# Patient Record
Sex: Female | Born: 1970
Health system: Southern US, Community
[De-identification: ages and names within clinical notes are randomized; demographics above are authoritative.]

## PROBLEM LIST (undated history)

## (undated) DIAGNOSIS — E079 Disorder of thyroid, unspecified: Secondary | ICD-10-CM

## (undated) DIAGNOSIS — F32A Depression, unspecified: Secondary | ICD-10-CM

## (undated) DIAGNOSIS — E785 Hyperlipidemia, unspecified: Secondary | ICD-10-CM

## (undated) DIAGNOSIS — F419 Anxiety disorder, unspecified: Secondary | ICD-10-CM

## (undated) DIAGNOSIS — T7840XA Allergy, unspecified, initial encounter: Secondary | ICD-10-CM

## (undated) HISTORY — DX: Hyperlipidemia, unspecified: E78.5

## (undated) HISTORY — DX: Allergy, unspecified, initial encounter: T78.40XA

## (undated) HISTORY — DX: Anxiety disorder, unspecified: F41.9

## (undated) HISTORY — DX: Depression, unspecified: F32.A

## (undated) HISTORY — PX: KNEE SURGERY: SHX244

---

## 2005-01-28 ENCOUNTER — Encounter: Admission: RE | Admit: 2005-01-28 | Discharge: 2005-01-28 | Payer: Self-pay | Admitting: Internal Medicine

## 2005-02-02 ENCOUNTER — Encounter: Admission: RE | Admit: 2005-02-02 | Discharge: 2005-05-03 | Payer: Self-pay | Admitting: Internal Medicine

## 2007-11-11 ENCOUNTER — Emergency Department (HOSPITAL_COMMUNITY): Admission: EM | Admit: 2007-11-11 | Discharge: 2007-11-11 | Payer: Self-pay | Admitting: Emergency Medicine

## 2010-07-04 ENCOUNTER — Encounter: Payer: Self-pay | Admitting: Internal Medicine

## 2014-01-30 ENCOUNTER — Other Ambulatory Visit: Payer: Self-pay

## 2014-02-01 ENCOUNTER — Other Ambulatory Visit: Payer: Self-pay

## 2014-02-01 DIAGNOSIS — Z1231 Encounter for screening mammogram for malignant neoplasm of breast: Secondary | ICD-10-CM

## 2014-02-05 ENCOUNTER — Ambulatory Visit
Admission: RE | Admit: 2014-02-05 | Discharge: 2014-02-05 | Disposition: A | Discharge status: Home / Self Care | Payer: BC Managed Care – PPO | Source: Ambulatory Visit

## 2014-02-05 DIAGNOSIS — Z1231 Encounter for screening mammogram for malignant neoplasm of breast: Secondary | ICD-10-CM

## 2014-02-07 ENCOUNTER — Other Ambulatory Visit: Payer: Self-pay | Admitting: Obstetrics & Gynecology

## 2014-02-07 DIAGNOSIS — R928 Other abnormal and inconclusive findings on diagnostic imaging of breast: Secondary | ICD-10-CM

## 2014-02-12 HISTORY — PX: BREAST BIOPSY: SHX20

## 2014-02-14 ENCOUNTER — Ambulatory Visit
Admission: RE | Admit: 2014-02-14 | Discharge: 2014-02-14 | Disposition: A | Discharge status: Home / Self Care | Payer: BC Managed Care – PPO | Source: Ambulatory Visit | Attending: Obstetrics & Gynecology | Admitting: Obstetrics & Gynecology

## 2014-02-14 ENCOUNTER — Other Ambulatory Visit: Payer: Self-pay | Admitting: Obstetrics & Gynecology

## 2014-02-14 DIAGNOSIS — R928 Other abnormal and inconclusive findings on diagnostic imaging of breast: Secondary | ICD-10-CM

## 2014-02-21 ENCOUNTER — Ambulatory Visit
Admission: RE | Admit: 2014-02-21 | Discharge: 2014-02-21 | Disposition: A | Discharge status: Home / Self Care | Payer: BC Managed Care – PPO | Source: Ambulatory Visit | Attending: Obstetrics & Gynecology | Admitting: Obstetrics & Gynecology

## 2014-02-21 DIAGNOSIS — R928 Other abnormal and inconclusive findings on diagnostic imaging of breast: Secondary | ICD-10-CM

## 2014-04-12 ENCOUNTER — Encounter (HOSPITAL_COMMUNITY): Payer: Self-pay | Admitting: Emergency Medicine

## 2014-04-12 ENCOUNTER — Emergency Department (HOSPITAL_COMMUNITY)
Admission: EM | Admit: 2014-04-12 | Discharge: 2014-04-12 | Disposition: A | Discharge status: Home / Self Care | Payer: BC Managed Care – PPO | Source: Home / Self Care | Attending: Family Medicine | Admitting: Family Medicine

## 2014-04-12 DIAGNOSIS — L247 Irritant contact dermatitis due to plants, except food: Secondary | ICD-10-CM

## 2014-04-12 HISTORY — DX: Disorder of thyroid, unspecified: E07.9

## 2014-04-12 MED ORDER — METHYLPREDNISOLONE SODIUM SUCC 125 MG IJ SOLR
INTRAMUSCULAR | Status: AC
  Filled 2014-04-12: qty 2

## 2014-04-12 MED ORDER — METHYLPREDNISOLONE SODIUM SUCC 125 MG IJ SOLR
125.0000 mg | Freq: Once | INTRAMUSCULAR | Status: AC
  Administered 2014-04-12: 125 mg via INTRAMUSCULAR

## 2014-04-12 MED ORDER — PREDNISONE 10 MG PO KIT: PACK | ORAL | Status: DC

## 2014-04-12 MED ORDER — HYDROXYZINE HCL 25 MG PO TABS: 25.0000 mg | ORAL_TABLET | ORAL | Status: DC | PRN

## 2014-04-12 NOTE — ED Provider Notes (Signed)
Medical screening examination/treatment/procedure(s) were performed by resident physician or non-physician practitioner and as supervising physician I was immediately available for consultation/collaboration.   Pauline Good MD.   Billy Fischer, MD 04/12/14 [phone]

## 2014-04-12 NOTE — Discharge Instructions (Signed)
Poison Ivy  Poison ivy is a inflammation of the skin (contact dermatitis) caused by touching the allergens on the leaves of the ivy plant following previous exposure to the plant. The rash usually appears 48 hours after exposure. The rash is usually bumps (papules) or blisters (vesicles) in a linear pattern. Depending on your own sensitivity, the rash may simply cause redness and itching, or it may also progress to blisters which may break open. These must be well cared for to prevent secondary bacterial (germ) infection, followed by scarring. Keep any open areas dry, clean, dressed, and covered with an antibacterial ointment if needed. The eyes may also get puffy. The puffiness is worst in the morning and gets better as the day progresses. This dermatitis usually heals without scarring, within 2 to 3 weeks without treatment.  HOME CARE INSTRUCTIONS   Thoroughly wash with soap and water as soon as you have been exposed to poison ivy. You have about one half hour to remove the plant resin before it will cause the rash. This washing will destroy the oil or antigen on the skin that is causing, or will cause, the rash. Be sure to wash under your fingernails as any plant resin there will continue to spread the rash. Do not rub skin vigorously when washing affected area. Poison ivy cannot spread if no oil from the plant remains on your body. A rash that has progressed to weeping sores will not spread the rash unless you have not washed thoroughly. It is also important to wash any clothes you have been wearing as these may carry active allergens. The rash will return if you wear the unwashed clothing, even several days later.  Avoidance of the plant in the future is the best measure. Poison ivy plant can be recognized by the number of leaves. Generally, poison ivy has three leaves with flowering branches on a single stem.  Diphenhydramine may be purchased over the counter and used as needed for itching. Do not drive with  this medication if it makes you drowsy.Ask your caregiver about medication for children.  SEEK MEDICAL CARE IF:  · Open sores develop.  · Redness spreads beyond area of rash.  · You notice purulent (pus-like) discharge.  · You have increased pain.  · Other signs of infection develop (such as fever).  Document Released: 05/28/2000 Document Revised: 08/23/2011 Document Reviewed: 11/08/2008  ExitCare® Patient Information ©2015 [employer]. This information is not intended to replace advice given to you by your health care provider. Make sure you discuss any questions you have with your health care provider.    Contact Dermatitis  Contact dermatitis is a reaction to certain substances that touch the skin. Contact dermatitis can be either irritant contact dermatitis or allergic contact dermatitis. Irritant contact dermatitis does not require previous exposure to the substance for a reaction to occur. Allergic contact dermatitis only occurs if you have been exposed to the substance before. Upon a repeat exposure, your body reacts to the substance.   CAUSES   Many substances can cause contact dermatitis. Irritant dermatitis is most commonly caused by repeated exposure to mildly irritating substances, such as:  · Makeup.  · Soaps.  · Detergents.  · Bleaches.  · Acids.  · Metal salts, such as nickel.  Allergic contact dermatitis is most commonly caused by exposure to:  · Poisonous plants.  · Chemicals (deodorants, shampoos).  · Jewelry.  · Latex.  · Neomycin in triple antibiotic cream.  · Preservatives in products, including   clothing.  SYMPTOMS   The area of skin that is exposed may develop:  · Dryness or flaking.  · Redness.  · Cracks.  · Itching.  · Pain or a burning sensation.  · Blisters.  With allergic contact dermatitis, there may also be swelling in areas such as the eyelids, mouth, or genitals.   DIAGNOSIS   Your caregiver can usually tell what the problem is by doing a physical exam. In cases where the cause is  uncertain and an allergic contact dermatitis is suspected, a patch skin test may be performed to help determine the cause of your dermatitis.  TREATMENT  Treatment includes protecting the skin from further contact with the irritating substance by avoiding that substance if possible. Barrier creams, powders, and gloves may be helpful. Your caregiver may also recommend:  · Steroid creams or ointments applied 2 times daily. For best results, soak the rash area in cool water for 20 minutes. Then apply the medicine. Cover the area with a plastic wrap. You can store the steroid cream in the refrigerator for a "chilly" effect on your rash. That may decrease itching. Oral steroid medicines may be needed in more severe cases.  · Antibiotics or antibacterial ointments if a skin infection is present.  · Antihistamine lotion or an antihistamine taken by mouth to ease itching.  · Lubricants to keep moisture in your skin.  · Burow's solution to reduce redness and soreness or to dry a weeping rash. Mix one packet or tablet of solution in 2 cups cool water. Dip a clean washcloth in the mixture, wring it out a bit, and put it on the affected area. Leave the cloth in place for 30 minutes. Do this as often as possible throughout the day.  · Taking several cornstarch or baking soda baths daily if the area is too large to cover with a washcloth.  Harsh chemicals, such as alkalis or acids, can cause skin damage that is like a burn. You should flush your skin for 15 to 20 minutes with cold water after such an exposure. You should also seek immediate medical care after exposure. Bandages (dressings), antibiotics, and pain medicine may be needed for severely irritated skin.   HOME CARE INSTRUCTIONS  · Avoid the substance that caused your reaction.  · Keep the area of skin that is affected away from hot water, soap, sunlight, chemicals, acidic substances, or anything else that would irritate your skin.  · Do not scratch the rash. Scratching  may cause the rash to become infected.  · You may take cool baths to help stop the itching.  · Only take over-the-counter or prescription medicines as directed by your caregiver.  · See your caregiver for follow-up care as directed to make sure your skin is healing properly.  SEEK MEDICAL CARE IF:   · Your condition is not better after 3 days of treatment.  · You seem to be getting worse.  · You see signs of infection such as swelling, tenderness, redness, soreness, or warmth in the affected area.  · You have any problems related to your medicines.  Document Released: 05/28/2000 Document Revised: 08/23/2011 Document Reviewed: 11/03/2010  ExitCare® Patient Information ©2015 [employer]. This information is not intended to replace advice given to you by your health care provider. Make sure you discuss any questions you have with your health care provider.

## 2014-04-12 NOTE — ED Provider Notes (Signed)
CSN: [id number]     Arrival date & time 04/12/14  1527 History   First MD Initiated Contact with Patient 04/12/14 1617     Chief Complaint  Patient presents with  . Rash   (Consider location/radiation/quality/duration/timing/severity/associated sxs/prior Treatment) HPI        43 year old female presents for evaluation of possible poison ivy rash. 5 days ago she was working in the garden started to notice a rash on her left forearm. He was extremely itchy. This rash is gotten worse and has since spread to her right arm, trunk, neck, and the left side of her face. She has been using, lotion and washing with TECNU with some relief but the rash continues to spread. She called her doctor today with her closed. No swelling of lips or tongue, no difficulty breathing. She had similar rashes in the past due to poison ivy.  Past Medical History  Diagnosis Date  . Thyroid disease    Past Surgical History  Procedure Laterality Date  . Knee surgery     History reviewed. No pertinent family history. History  Substance Use Topics  . Smoking status: Never Smoker   . Smokeless tobacco: Not on file  . Alcohol Use: Yes   OB History   Grav Para Term Preterm Abortions TAB SAB Ect Mult Living                 Review of Systems  Skin: Positive for rash.  All other systems reviewed and are negative.   Allergies  Review of patient's allergies indicates no known allergies.  Home Medications   Prior to Admission medications   Medication Sig Start Date End Date Taking? Authorizing Provider  BuPROPion HCl (WELLBUTRIN SR PO) Take by mouth.   Yes Historical Provider, MD  Montelukast Sodium (SINGULAIR PO) Take by mouth.   Yes Historical Provider, MD  thyroid (ARMOUR) 90 MG tablet Take 90 mg by mouth daily.   Yes Historical Provider, MD  hydrOXYzine (ATARAX/VISTARIL) 25 MG tablet Take 1 tablet (25 mg total) by mouth every 4 (four) hours as needed for itching. 04/12/14   Freeman Caldron Keshayla Schrum, PA-C  PredniSONE  10 MG KIT 12 day taper dose pack. Use as directed 04/12/14   Liam Graham, PA-C   BP 113/78  Pulse 70  Temp(Src) 98.3 F (36.8 C) (Oral)  Resp 18  SpO2 100%  LMP 04/05/2014 Physical Exam  Nursing note and vitals reviewed. Constitutional: She is oriented to person, place, and time. Vital signs are normal. She appears well-developed and well-nourished. No distress.  HENT:  Head: Normocephalic and atraumatic.  Pulmonary/Chest: Effort normal. No respiratory distress.  Neurological: She is alert and oriented to person, place, and time. She has normal strength. Coordination normal.  Skin: Skin is warm and dry. Rash noted. Rash is papular (erythematous, papular rash in a linear distribution on the left forearm, with various sized confluences of papules on the right forearm, left side of neck, and left side of face). She is not diaphoretic.  Psychiatric: She has a normal mood and affect. Judgment normal.    ED Course  Procedures (including critical care time) Labs Review Labs Reviewed - No data to display  Imaging Review No results found.   MDM   1. Irritant contact dermatitis due to plant    Consistent with irritant plant contact dermatitis. Will give an injection of 125 mg Solu-Medrol here and discharged with prednisone and hydroxyzine.  F/U as needed   New Prescriptions   HYDROXYZINE (  ATARAX/VISTARIL) 25 MG TABLET    Take 1 tablet (25 mg total) by mouth every 4 (four) hours as needed for itching.   PREDNISONE 10 MG KIT    12 day taper dose pack. Use as directed      H , PA-C 04/12/14 1640 

## 2014-04-12 NOTE — ED Notes (Signed)
PT  STATES   WAS  WORKING  IN  THE  GARDEN    5  DAYS  AGO       SHE  NOTICED  RASH ON  ARMS  GETTING  WORSE  NOT  RELEIVED  BY OTC  MEDS     Pt has  No  Angioedema  And  Is  In no  Severe  Distress

## 2015-10-09 DIAGNOSIS — R05 Cough: Secondary | ICD-10-CM | POA: Diagnosis not present

## 2015-10-09 DIAGNOSIS — R918 Other nonspecific abnormal finding of lung field: Secondary | ICD-10-CM | POA: Diagnosis not present

## 2015-12-19 ENCOUNTER — Encounter (HOSPITAL_COMMUNITY): Payer: Self-pay

## 2015-12-19 ENCOUNTER — Ambulatory Visit (HOSPITAL_COMMUNITY)
Admission: EM | Admit: 2015-12-19 | Discharge: 2015-12-19 | Disposition: A | Discharge status: Home / Self Care | Payer: BLUE CROSS/BLUE SHIELD | Attending: Family Medicine | Admitting: Family Medicine

## 2015-12-19 DIAGNOSIS — W57XXXA Bitten or stung by nonvenomous insect and other nonvenomous arthropods, initial encounter: Secondary | ICD-10-CM | POA: Diagnosis not present

## 2015-12-19 DIAGNOSIS — T148 Other injury of unspecified body region: Secondary | ICD-10-CM | POA: Diagnosis not present

## 2015-12-19 DIAGNOSIS — L259 Unspecified contact dermatitis, unspecified cause: Secondary | ICD-10-CM

## 2015-12-19 MED ORDER — TRIAMCINOLONE ACETONIDE 0.1 % EX CREA
1.0000 | TOPICAL_CREAM | Freq: Two times a day (BID) | CUTANEOUS | Status: DC
Start: 2015-12-19 — End: 2022-03-29

## 2015-12-19 NOTE — ED Provider Notes (Signed)
CSN: [id number]     Arrival date & time 12/19/15  1917 History   First MD Initiated Contact with Patient 12/19/15 1954     Chief Complaint  Patient presents with  . Abrasion   (Consider location/radiation/quality/duration/timing/severity/associated sxs/prior Treatment) HPI Comments: 45 year old female states that she developed  an itchy red rash to the lower extremities over the past couple of days. the right leg appears to be insect bites with small red dot surrounded by some pallor. The left leg has blotchy erythematous areas with small papules more like contact dermatitis. Both areas are itchy. No evidence of cellulitis.   Past Medical History  Diagnosis Date  . Thyroid disease    Past Surgical History  Procedure Laterality Date  . Knee surgery     No family history on file. Social History  Substance Use Topics  . Smoking status: Never Smoker   . Smokeless tobacco: Never Used  . Alcohol Use: Yes     Comment: socail   OB History    No data available     Review of Systems  Constitutional: Negative.   HENT: Negative.   Respiratory: Negative.   Skin: Positive for rash.  Neurological: Negative.   All other systems reviewed and are negative.   Allergies  Review of patient's allergies indicates no known allergies.  Home Medications   Prior to Admission medications   Medication Sig Start Date End Date Taking? Authorizing Provider  BuPROPion HCl (WELLBUTRIN SR PO) Take by mouth.   Yes Historical Provider, MD  Montelukast Sodium (SINGULAIR PO) Take by mouth.   Yes Historical Provider, MD  thyroid (ARMOUR) 90 MG tablet Take 90 mg by mouth daily.   Yes Historical Provider, MD  hydrOXYzine (ATARAX/VISTARIL) 25 MG tablet Take 1 tablet (25 mg total) by mouth every 4 (four) hours as needed for itching. 04/12/14   Freeman Caldron Baker, PA-C  PredniSONE 10 MG KIT 12 day taper dose pack. Use as directed 04/12/14   Liam Graham, PA-C  triamcinolone cream (KENALOG) 0.1 % Apply 1  application topically 2 (two) times daily. 12/19/15   Janne Napoleon, NP   Meds Ordered and Administered this Visit  Medications - No data to display  BP 108/69 mmHg  Pulse 72  Temp(Src) 98.7 F (37.1 C) (Oral)  Resp 16  SpO2 100%  LMP 12/14/2015 (Exact Date) No data found.   Physical Exam  Constitutional: She is oriented to person, place, and time. She appears well-developed and well-nourished. No distress.  Eyes: EOM are normal.  Neck: Normal range of motion. Neck supple.  Cardiovascular: Normal rate.   Pulmonary/Chest: Effort normal. No respiratory distress.  Musculoskeletal: She exhibits no edema.  Neurological: She is alert and oriented to person, place, and time. She exhibits normal muscle tone.  Skin: Skin is warm and dry.  Skin lesions to the lower extremities around the ankles as described in history of present illness. No evidence of infection or cellulitis. No drainage or bleeding.  Psychiatric: She has a normal mood and affect.  Nursing note and vitals reviewed.   ED Course  Procedures (including critical care time)  Labs Review Labs Reviewed - No data to display  Imaging Review No results found.   Visual Acuity Review  Right Eye Distance:   Left Eye Distance:   Bilateral Distance:    Right Eye Near:   Left Eye Near:    Bilateral Near:         MDM   1. Contact dermatitis  2. Insect bites    The lesions to the right ankle appeared to be more like insect bites. The lesions to the left leg appears to be more like a contact dermatitis. Either way treated the same way. Triamcinolone cream twice a day and Benadryl topical gel or ointment every 4 hours. Meds ordered this encounter  Medications  . triamcinolone cream (KENALOG) 0.1 %    Sig: Apply 1 application topically 2 (two) times daily.    Dispense:  30 g    Refill:  0    Order Specific Question:  Supervising Provider    Answer:  Billy Fischer [[id number]]       Janne Napoleon, NP 12/19/15 2038

## 2015-12-19 NOTE — Discharge Instructions (Signed)

## 2015-12-19 NOTE — ED Notes (Signed)
Patient presents with rash on bilateral lower legs x2 days pt thinks it may be poison ivy, pt has applied cortisone for itch No acute distress

## 2016-01-06 DIAGNOSIS — L988 Other specified disorders of the skin and subcutaneous tissue: Secondary | ICD-10-CM | POA: Diagnosis not present

## 2016-01-06 DIAGNOSIS — D2339 Other benign neoplasm of skin of other parts of face: Secondary | ICD-10-CM | POA: Diagnosis not present

## 2016-05-11 DIAGNOSIS — J069 Acute upper respiratory infection, unspecified: Secondary | ICD-10-CM | POA: Diagnosis not present

## 2016-07-15 DIAGNOSIS — E559 Vitamin D deficiency, unspecified: Secondary | ICD-10-CM | POA: Diagnosis not present

## 2016-07-15 DIAGNOSIS — Z0001 Encounter for general adult medical examination with abnormal findings: Secondary | ICD-10-CM | POA: Diagnosis not present

## 2016-07-15 DIAGNOSIS — E7800 Pure hypercholesterolemia, unspecified: Secondary | ICD-10-CM | POA: Diagnosis not present

## 2016-07-22 DIAGNOSIS — E559 Vitamin D deficiency, unspecified: Secondary | ICD-10-CM | POA: Diagnosis not present

## 2016-07-22 DIAGNOSIS — F339 Major depressive disorder, recurrent, unspecified: Secondary | ICD-10-CM | POA: Diagnosis not present

## 2016-07-22 DIAGNOSIS — E039 Hypothyroidism, unspecified: Secondary | ICD-10-CM | POA: Diagnosis not present

## 2016-07-22 DIAGNOSIS — Z0000 Encounter for general adult medical examination without abnormal findings: Secondary | ICD-10-CM | POA: Diagnosis not present

## 2016-09-23 DIAGNOSIS — H01022 Squamous blepharitis right lower eyelid: Secondary | ICD-10-CM | POA: Diagnosis not present

## 2016-09-23 DIAGNOSIS — H01024 Squamous blepharitis left upper eyelid: Secondary | ICD-10-CM | POA: Diagnosis not present

## 2016-09-23 DIAGNOSIS — H10413 Chronic giant papillary conjunctivitis, bilateral: Secondary | ICD-10-CM | POA: Diagnosis not present

## 2016-09-23 DIAGNOSIS — H01021 Squamous blepharitis right upper eyelid: Secondary | ICD-10-CM | POA: Diagnosis not present

## 2016-10-06 DIAGNOSIS — H01021 Squamous blepharitis right upper eyelid: Secondary | ICD-10-CM | POA: Diagnosis not present

## 2016-10-06 DIAGNOSIS — H01022 Squamous blepharitis right lower eyelid: Secondary | ICD-10-CM | POA: Diagnosis not present

## 2016-10-06 DIAGNOSIS — H10413 Chronic giant papillary conjunctivitis, bilateral: Secondary | ICD-10-CM | POA: Diagnosis not present

## 2016-10-06 DIAGNOSIS — H04123 Dry eye syndrome of bilateral lacrimal glands: Secondary | ICD-10-CM | POA: Diagnosis not present

## 2017-01-25 DIAGNOSIS — E669 Obesity, unspecified: Secondary | ICD-10-CM | POA: Diagnosis not present

## 2017-01-25 DIAGNOSIS — E559 Vitamin D deficiency, unspecified: Secondary | ICD-10-CM | POA: Diagnosis not present

## 2017-01-25 DIAGNOSIS — N926 Irregular menstruation, unspecified: Secondary | ICD-10-CM | POA: Diagnosis not present

## 2017-01-25 DIAGNOSIS — R5383 Other fatigue: Secondary | ICD-10-CM | POA: Diagnosis not present

## 2017-01-25 DIAGNOSIS — E039 Hypothyroidism, unspecified: Secondary | ICD-10-CM | POA: Diagnosis not present

## 2017-01-25 DIAGNOSIS — R5381 Other malaise: Secondary | ICD-10-CM | POA: Diagnosis not present

## 2017-02-28 DIAGNOSIS — N943 Premenstrual tension syndrome: Secondary | ICD-10-CM | POA: Diagnosis not present

## 2017-03-09 DIAGNOSIS — E039 Hypothyroidism, unspecified: Secondary | ICD-10-CM | POA: Diagnosis not present

## 2017-03-09 DIAGNOSIS — R5383 Other fatigue: Secondary | ICD-10-CM | POA: Diagnosis not present

## 2017-03-09 DIAGNOSIS — R5381 Other malaise: Secondary | ICD-10-CM | POA: Diagnosis not present

## 2017-03-09 DIAGNOSIS — R6882 Decreased libido: Secondary | ICD-10-CM | POA: Diagnosis not present

## 2017-10-06 DIAGNOSIS — H10413 Chronic giant papillary conjunctivitis, bilateral: Secondary | ICD-10-CM | POA: Diagnosis not present

## 2018-01-30 ENCOUNTER — Encounter: Payer: Self-pay | Admitting: Sports Medicine

## 2018-01-30 ENCOUNTER — Ambulatory Visit (INDEPENDENT_AMBULATORY_CARE_PROVIDER_SITE_OTHER): Payer: BLUE CROSS/BLUE SHIELD | Admitting: Sports Medicine

## 2018-01-30 VITALS — BP 114/80 | Ht 68.0 in | Wt 265.0 lb

## 2018-01-30 DIAGNOSIS — S8002XA Contusion of left knee, initial encounter: Secondary | ICD-10-CM | POA: Diagnosis not present

## 2018-01-30 DIAGNOSIS — S9002XA Contusion of left ankle, initial encounter: Secondary | ICD-10-CM

## 2018-01-30 NOTE — Progress Notes (Signed)
   Subjective:    Patient ID: Cassie Flowers, female    DOB: [birth year], 47 y.o.   MRN: [id number]  HPIchief complaint: Left knee and left ankle pain  Very pleasant 47 year old female comes in today complaining of left knee pain and ankle pain that began after she fell coming down some rock stairs 2 weeks ago. She lost her footing and landed directly on her left knee. She had immediate pain and swelling around the knee and developed medial sided ankle pain a couple days later. Her symptoms are improving. She is status post left knee reconstruction in 2001 for reoccurring patellar dislocations. She has done well postoperatively. She denies any significant injury to her ankle in the past. All of her pain and swelling were medial. She was initially limping but is now walking with a normal gait. No numbness or tingling.  Past medical history reviewed Medications reviewed Allergies reviewed   Review of Systems As above    Objective:   Physical Exam  Obese. No acute distress.Awake alert and oriented 3. Vital signs reviewed  Left knee: Good range of motion. Mild diffuse soft tissue swelling. Well-healed surgical incision in the anterior knee consistent with her prior reconstruction. Good joint stability. Slight tenderness to palpation diffusely around the anterior knee but nothing focal. Extensor mechanism is intact.  Left ankle: Full range of motion. No effusion. Mild soft tissue swelling and ecchymosis along the medial ankle. No tenderness to palpation. Good ligamentous stability. Neurovascularly intact distally.  Patient walks without significant limp.      Assessment & Plan:   Left knee pain secondary to contusion Left ankle pain secondary to contusion Status post remote left knee reconstruction for recurrent patellar dislocations  Patient's symptoms are currently resolving. We are going to take a watchful waiting approach I do not feel like we need imaging at this time but if her  symptoms persist or worsen then I would start with getting x-rays. Patient may increase activity as tolerated and follow-up for ongoing or recalcitrant issues.

## 2018-06-01 DIAGNOSIS — B029 Zoster without complications: Secondary | ICD-10-CM | POA: Diagnosis not present

## 2018-06-01 DIAGNOSIS — Z23 Encounter for immunization: Secondary | ICD-10-CM | POA: Diagnosis not present

## 2018-07-19 DIAGNOSIS — H538 Other visual disturbances: Secondary | ICD-10-CM | POA: Diagnosis not present

## 2018-08-07 DIAGNOSIS — J069 Acute upper respiratory infection, unspecified: Secondary | ICD-10-CM | POA: Diagnosis not present

## 2018-08-09 DIAGNOSIS — R21 Rash and other nonspecific skin eruption: Secondary | ICD-10-CM | POA: Diagnosis not present

## 2018-08-09 DIAGNOSIS — R7309 Other abnormal glucose: Secondary | ICD-10-CM | POA: Diagnosis not present

## 2018-08-25 DIAGNOSIS — Z0000 Encounter for general adult medical examination without abnormal findings: Secondary | ICD-10-CM | POA: Diagnosis not present

## 2018-08-25 DIAGNOSIS — R7309 Other abnormal glucose: Secondary | ICD-10-CM | POA: Diagnosis not present

## 2018-08-29 DIAGNOSIS — E039 Hypothyroidism, unspecified: Secondary | ICD-10-CM | POA: Diagnosis not present

## 2018-08-29 DIAGNOSIS — E669 Obesity, unspecified: Secondary | ICD-10-CM | POA: Diagnosis not present

## 2018-08-29 DIAGNOSIS — E559 Vitamin D deficiency, unspecified: Secondary | ICD-10-CM | POA: Diagnosis not present

## 2018-08-29 DIAGNOSIS — R5383 Other fatigue: Secondary | ICD-10-CM | POA: Diagnosis not present

## 2018-08-29 DIAGNOSIS — R5381 Other malaise: Secondary | ICD-10-CM | POA: Diagnosis not present

## 2018-08-29 DIAGNOSIS — N920 Excessive and frequent menstruation with regular cycle: Secondary | ICD-10-CM | POA: Diagnosis not present

## 2018-09-01 DIAGNOSIS — R21 Rash and other nonspecific skin eruption: Secondary | ICD-10-CM | POA: Diagnosis not present

## 2018-09-01 DIAGNOSIS — Z0000 Encounter for general adult medical examination without abnormal findings: Secondary | ICD-10-CM | POA: Diagnosis not present

## 2018-11-01 DIAGNOSIS — L249 Irritant contact dermatitis, unspecified cause: Secondary | ICD-10-CM | POA: Diagnosis not present

## 2018-11-01 DIAGNOSIS — L84 Corns and callosities: Secondary | ICD-10-CM | POA: Diagnosis not present

## 2018-12-21 DIAGNOSIS — E039 Hypothyroidism, unspecified: Secondary | ICD-10-CM | POA: Diagnosis not present

## 2019-01-10 DIAGNOSIS — R5381 Other malaise: Secondary | ICD-10-CM | POA: Diagnosis not present

## 2019-01-10 DIAGNOSIS — R5383 Other fatigue: Secondary | ICD-10-CM | POA: Diagnosis not present

## 2019-01-10 DIAGNOSIS — L710 Perioral dermatitis: Secondary | ICD-10-CM | POA: Diagnosis not present

## 2019-01-10 DIAGNOSIS — E039 Hypothyroidism, unspecified: Secondary | ICD-10-CM | POA: Diagnosis not present

## 2019-07-10 DIAGNOSIS — L821 Other seborrheic keratosis: Secondary | ICD-10-CM | POA: Diagnosis not present

## 2019-07-10 DIAGNOSIS — L814 Other melanin hyperpigmentation: Secondary | ICD-10-CM | POA: Diagnosis not present

## 2019-07-10 DIAGNOSIS — X32XXXS Exposure to sunlight, sequela: Secondary | ICD-10-CM | POA: Diagnosis not present

## 2019-09-04 ENCOUNTER — Other Ambulatory Visit: Payer: Self-pay | Admitting: Family Medicine

## 2019-09-04 DIAGNOSIS — Z1231 Encounter for screening mammogram for malignant neoplasm of breast: Secondary | ICD-10-CM

## 2019-09-20 DIAGNOSIS — E669 Obesity, unspecified: Secondary | ICD-10-CM | POA: Diagnosis not present

## 2019-09-20 DIAGNOSIS — N644 Mastodynia: Secondary | ICD-10-CM | POA: Diagnosis not present

## 2019-09-20 DIAGNOSIS — Z0000 Encounter for general adult medical examination without abnormal findings: Secondary | ICD-10-CM | POA: Diagnosis not present

## 2019-09-20 DIAGNOSIS — R079 Chest pain, unspecified: Secondary | ICD-10-CM | POA: Diagnosis not present

## 2019-09-21 DIAGNOSIS — Z0000 Encounter for general adult medical examination without abnormal findings: Secondary | ICD-10-CM | POA: Diagnosis not present

## 2019-09-21 DIAGNOSIS — R7309 Other abnormal glucose: Secondary | ICD-10-CM | POA: Diagnosis not present

## 2019-09-21 DIAGNOSIS — E559 Vitamin D deficiency, unspecified: Secondary | ICD-10-CM | POA: Diagnosis not present

## 2019-09-24 ENCOUNTER — Other Ambulatory Visit: Payer: Self-pay | Admitting: Family Medicine

## 2019-09-24 DIAGNOSIS — N644 Mastodynia: Secondary | ICD-10-CM

## 2019-10-01 DIAGNOSIS — Z20822 Contact with and (suspected) exposure to covid-19: Secondary | ICD-10-CM | POA: Diagnosis not present

## 2019-10-13 HISTORY — PX: BREAST BIOPSY: SHX20

## 2019-10-19 ENCOUNTER — Ambulatory Visit
Admission: RE | Admit: 2019-10-19 | Discharge: 2019-10-19 | Disposition: A | Discharge status: Home / Self Care | Payer: BLUE CROSS/BLUE SHIELD | Source: Ambulatory Visit | Attending: Family Medicine | Admitting: Family Medicine

## 2019-10-19 ENCOUNTER — Other Ambulatory Visit: Payer: Self-pay | Admitting: Family Medicine

## 2019-10-19 ENCOUNTER — Other Ambulatory Visit: Payer: Self-pay

## 2019-10-19 DIAGNOSIS — R921 Mammographic calcification found on diagnostic imaging of breast: Secondary | ICD-10-CM | POA: Diagnosis not present

## 2019-10-19 DIAGNOSIS — N644 Mastodynia: Secondary | ICD-10-CM

## 2019-10-19 DIAGNOSIS — N6324 Unspecified lump in the left breast, lower inner quadrant: Secondary | ICD-10-CM | POA: Diagnosis not present

## 2019-10-22 DIAGNOSIS — E559 Vitamin D deficiency, unspecified: Secondary | ICD-10-CM | POA: Diagnosis not present

## 2019-10-22 DIAGNOSIS — R5383 Other fatigue: Secondary | ICD-10-CM | POA: Diagnosis not present

## 2019-10-22 DIAGNOSIS — N926 Irregular menstruation, unspecified: Secondary | ICD-10-CM | POA: Diagnosis not present

## 2019-10-22 DIAGNOSIS — E039 Hypothyroidism, unspecified: Secondary | ICD-10-CM | POA: Diagnosis not present

## 2019-10-22 DIAGNOSIS — R5381 Other malaise: Secondary | ICD-10-CM | POA: Diagnosis not present

## 2019-10-29 ENCOUNTER — Other Ambulatory Visit: Payer: Self-pay

## 2019-10-29 ENCOUNTER — Ambulatory Visit
Admission: RE | Admit: 2019-10-29 | Discharge: 2019-10-29 | Disposition: A | Discharge status: Home / Self Care | Payer: BC Managed Care – PPO | Source: Ambulatory Visit | Attending: Family Medicine | Admitting: Family Medicine

## 2019-10-29 DIAGNOSIS — N6012 Diffuse cystic mastopathy of left breast: Secondary | ICD-10-CM | POA: Diagnosis not present

## 2019-10-29 DIAGNOSIS — N644 Mastodynia: Secondary | ICD-10-CM

## 2019-10-29 DIAGNOSIS — N6324 Unspecified lump in the left breast, lower inner quadrant: Secondary | ICD-10-CM | POA: Diagnosis not present

## 2019-11-01 ENCOUNTER — Other Ambulatory Visit: Payer: Self-pay | Admitting: Family Medicine

## 2019-11-01 DIAGNOSIS — R921 Mammographic calcification found on diagnostic imaging of breast: Secondary | ICD-10-CM

## 2019-11-05 DIAGNOSIS — R5381 Other malaise: Secondary | ICD-10-CM | POA: Diagnosis not present

## 2019-11-05 DIAGNOSIS — N951 Menopausal and female climacteric states: Secondary | ICD-10-CM | POA: Diagnosis not present

## 2019-11-05 DIAGNOSIS — E039 Hypothyroidism, unspecified: Secondary | ICD-10-CM | POA: Diagnosis not present

## 2019-11-05 DIAGNOSIS — R5383 Other fatigue: Secondary | ICD-10-CM | POA: Diagnosis not present

## 2020-05-02 ENCOUNTER — Other Ambulatory Visit: Payer: Self-pay

## 2020-05-02 ENCOUNTER — Ambulatory Visit
Admission: RE | Admit: 2020-05-02 | Discharge: 2020-05-02 | Disposition: A | Discharge status: Home / Self Care | Payer: BC Managed Care – PPO | Source: Ambulatory Visit | Attending: Family Medicine | Admitting: Family Medicine

## 2020-05-02 ENCOUNTER — Other Ambulatory Visit: Payer: Self-pay | Admitting: Family Medicine

## 2020-05-02 DIAGNOSIS — R921 Mammographic calcification found on diagnostic imaging of breast: Secondary | ICD-10-CM | POA: Diagnosis not present

## 2020-05-02 DIAGNOSIS — N6489 Other specified disorders of breast: Secondary | ICD-10-CM

## 2020-05-02 DIAGNOSIS — N644 Mastodynia: Secondary | ICD-10-CM

## 2020-05-07 ENCOUNTER — Ambulatory Visit
Admission: RE | Admit: 2020-05-07 | Discharge: 2020-05-07 | Disposition: A | Discharge status: Home / Self Care | Payer: BC Managed Care – PPO | Source: Ambulatory Visit | Attending: Family Medicine | Admitting: Family Medicine

## 2020-05-07 ENCOUNTER — Other Ambulatory Visit: Payer: Self-pay

## 2020-05-07 DIAGNOSIS — N6021 Fibroadenosis of right breast: Secondary | ICD-10-CM | POA: Diagnosis not present

## 2020-05-07 DIAGNOSIS — N6489 Other specified disorders of breast: Secondary | ICD-10-CM

## 2020-05-07 DIAGNOSIS — R928 Other abnormal and inconclusive findings on diagnostic imaging of breast: Secondary | ICD-10-CM | POA: Diagnosis not present

## 2020-05-07 HISTORY — PX: BREAST BIOPSY: SHX20

## 2020-08-17 IMAGING — US US BREAST*L* LIMITED INC AXILLA
1 series · 8 of 8 positions shown · non-contrast
Comparison: Previous exam(s).

CLINICAL DATA: 49-year-old female presenting for annual exam as
well as left breast pain anteriorly which is no longer present.

EXAM:
DIGITAL DIAGNOSTIC BILATERAL MAMMOGRAM WITH CAD AND TOMO
ULTRASOUND LEFT BREAST

[Series 1: us breast*left* limited inc axilla · 0.07mm/px · 8 of 8 slices shown]
[im 1/8]
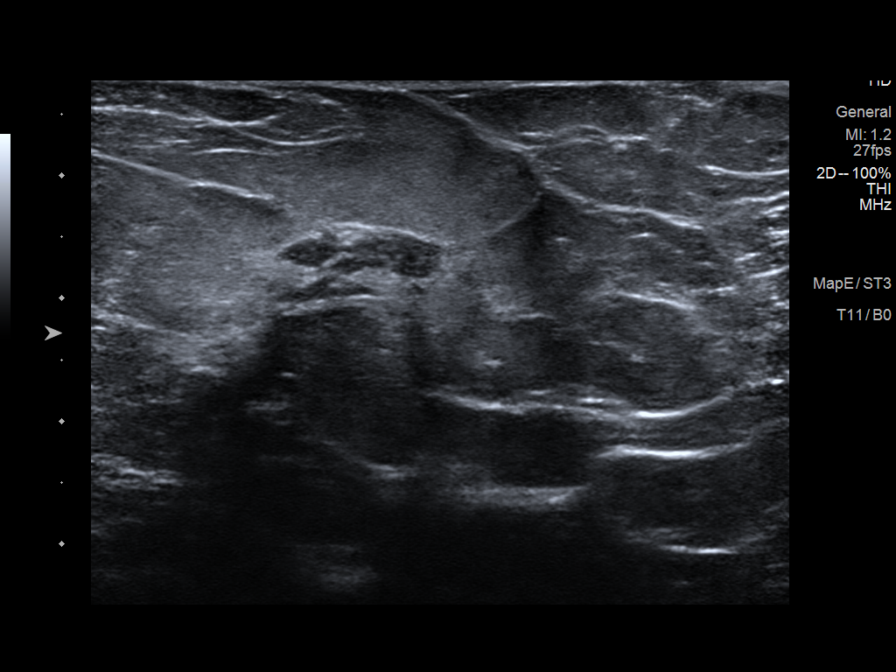
[im 2/8]
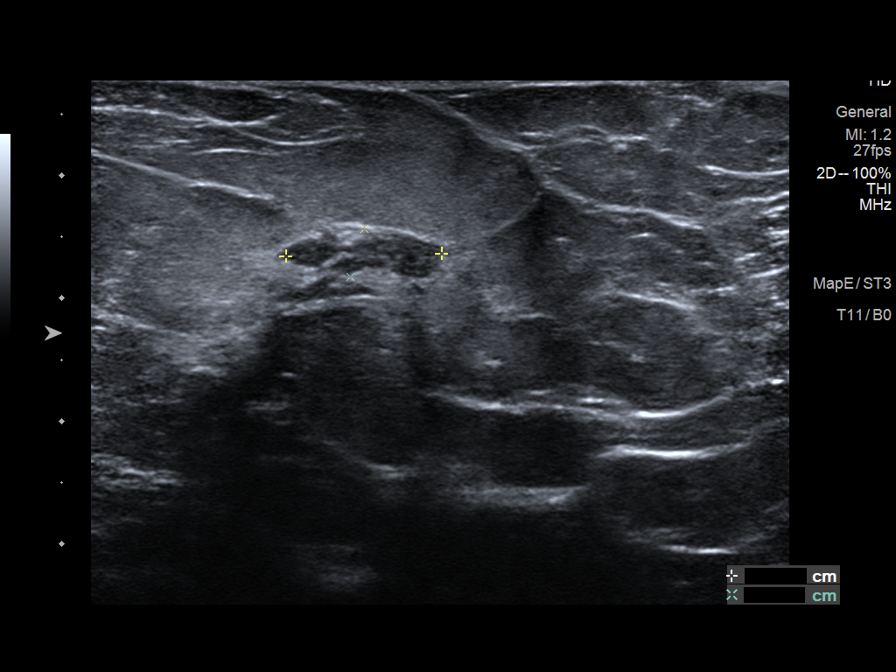
[im 3/8]
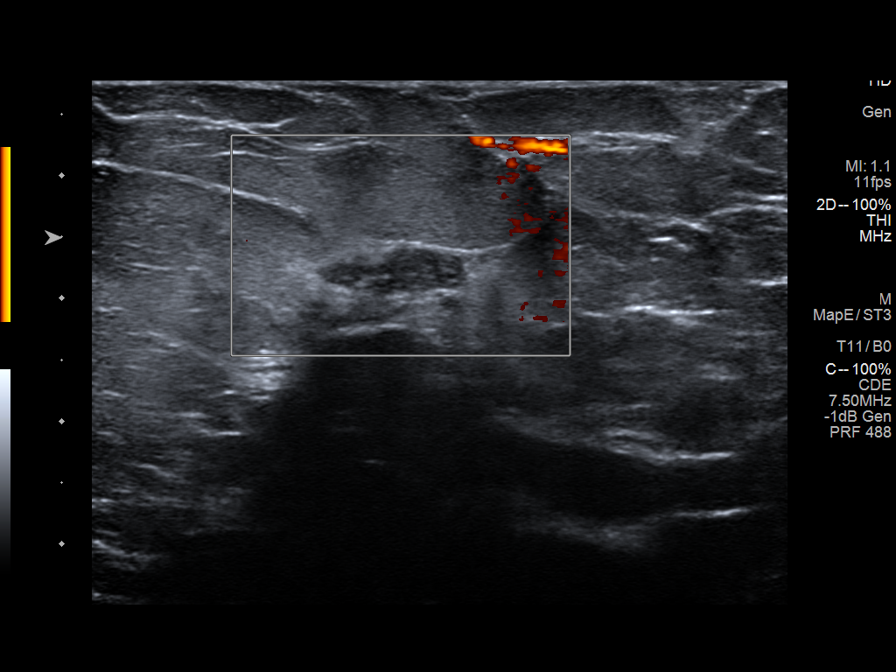
[im 4/8]
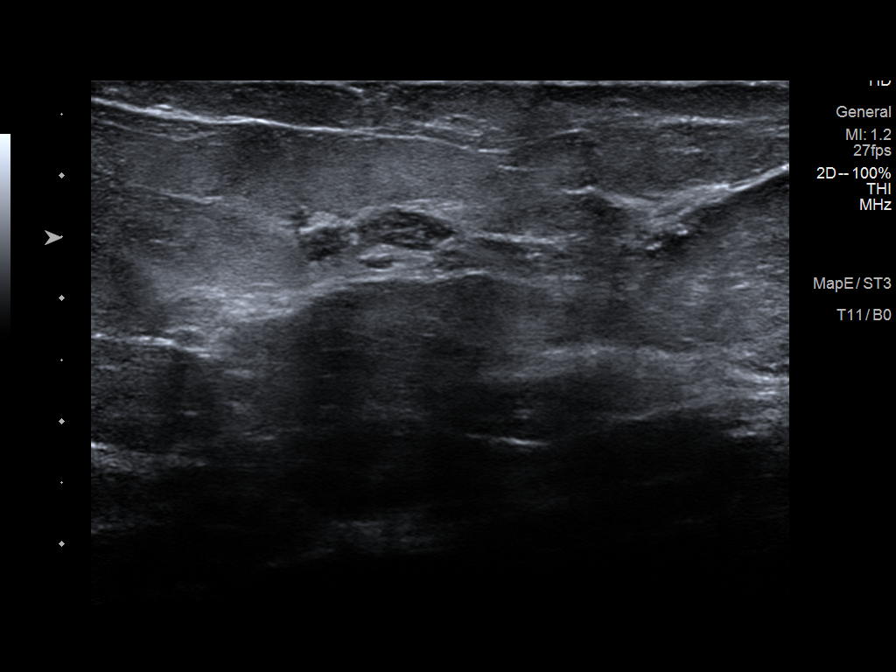
[im 5/8]
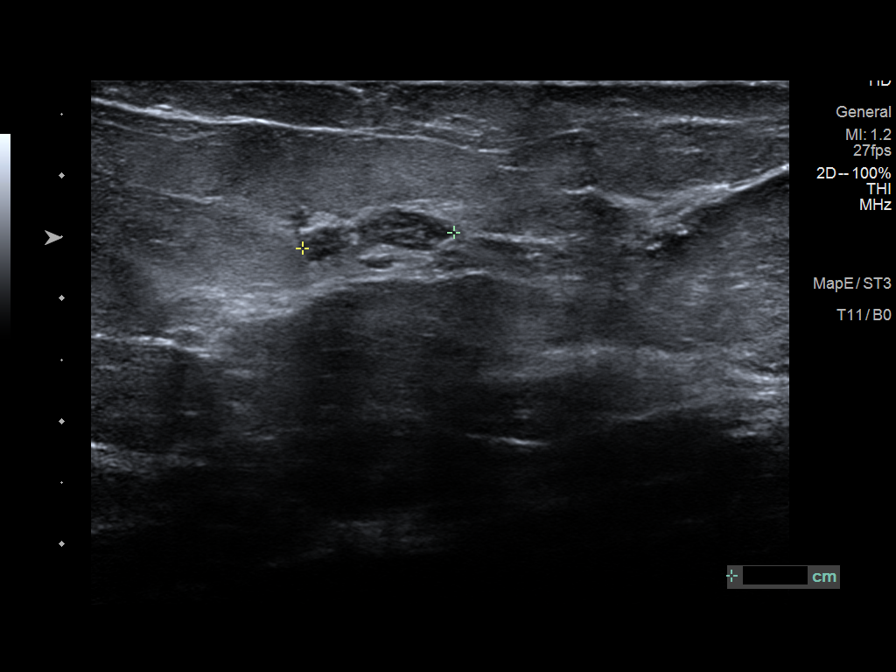
[im 6/8]
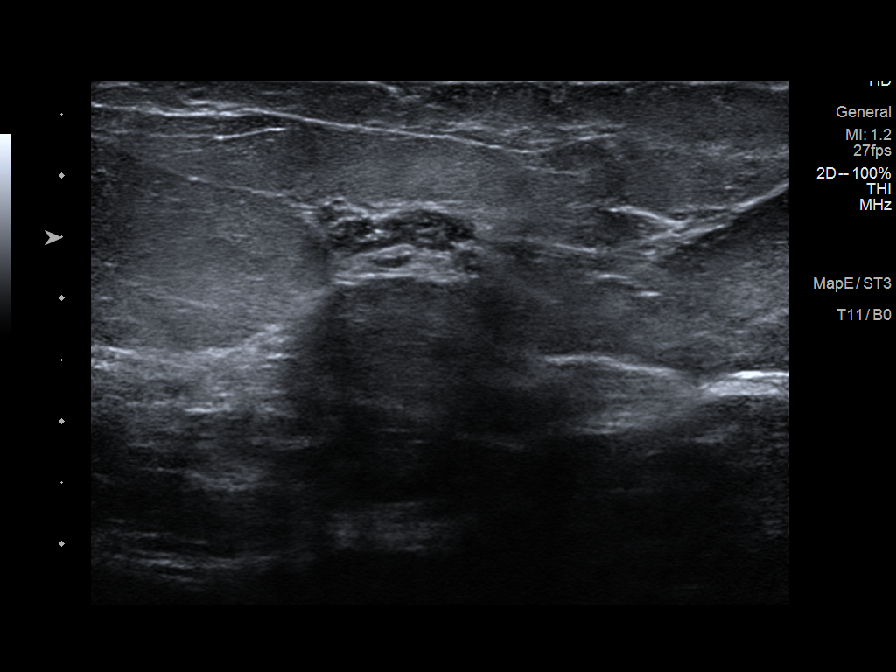
[im 7/8]
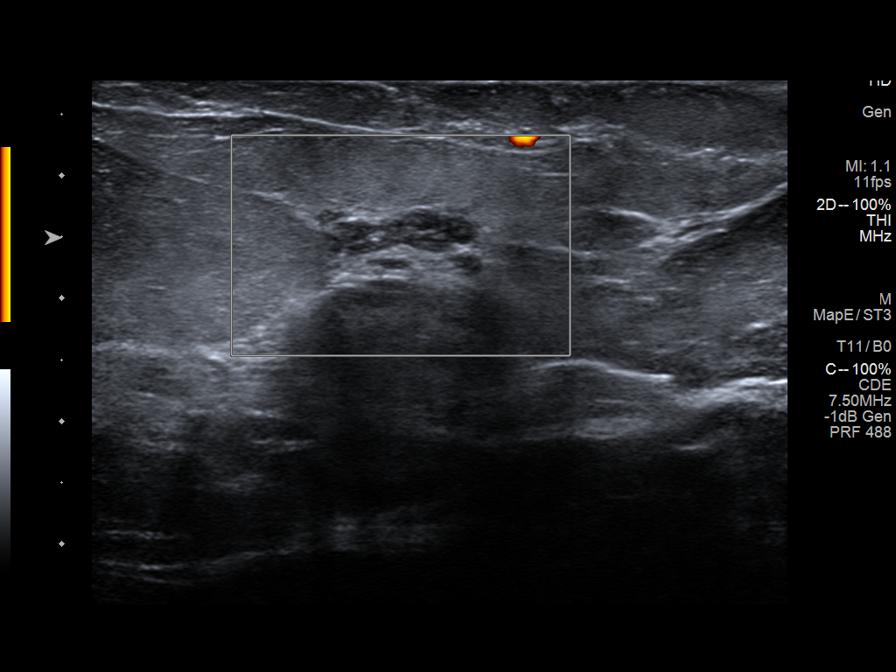
[im 8/8]
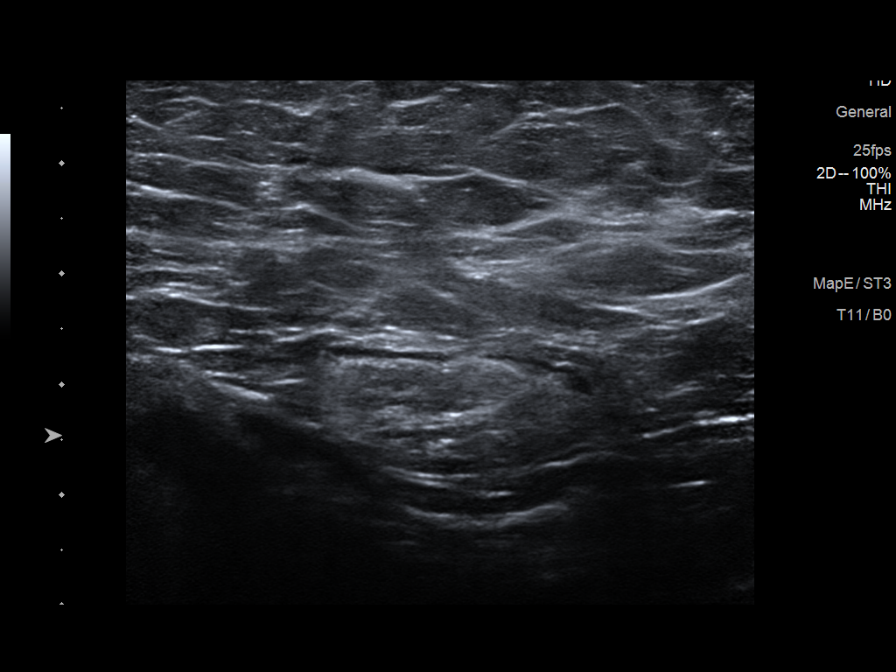

[8 of 8 positions shown; findings below may reference images not displayed]

ACR Breast Density Category c: The breast tissue is heterogeneously
dense, which may obscure small masses.
FINDINGS: Mammogram:

Right breast:

Spot magnification 2D views of the right breast were performed in
addition to standard full field tomosynthesis. On the spot
magnification views in the upper-outer quadrant of the right breast
there are loosely grouped round and amorphous calcifications
spanning approximately 1.9 cm. No additional findings in the right
breast.

Left breast:

Spot compression tomosynthesis views were performed in addition to
full field standard views. There is a mass in the lower inner
quadrant of the left breast measuring approximately 1.1 cm. No
additional findings in the left breast.

Ultrasound:

Targeted ultrasound is performed in the left breast at 7:30 o'clock
4 cm from the nipple demonstrating an oval circumscribed hypoechoic
mass measuring 1.3 x 0.4 x 1.2 cm. No internal blood flow
identified. This mass corresponds to the mammographic finding.
Targeted ultrasound of the left axilla demonstrates normal-appearing
lymph nodes.
IMPRESSION: 1. Right breast loosely grouped calcifications in the upper outer
quadrant are probably benign.

2. Left breast mass at 7:30 o'clock measuring 1.3 cm is
indeterminate.

RECOMMENDATION:
1. Ultrasound-guided core needle biopsy of the left breast mass at
7:30 o'clock.

2. If the left breast biopsy is benign, recommend six-month
mammographic follow-up of the right breast. If the left breast
biopsy demonstrates atypia or malignancy, recommend stereotactic
core needle biopsy of the calcifications in the right breast.

I have discussed the findings and recommendations with the patient.
If applicable, a reminder letter will be sent to the patient
regarding the next appointment.

BI-RADS CATEGORY  4: Suspicious.

## 2020-08-27 IMAGING — US US BREAST BX W LOC DEV 1ST LESION IMG BX SPEC US GUIDE*L*
1 series · 12 of 14 positions shown · non-contrast
Comparison: Previous exam(s).
COMPARISON: Previous exam(s).

Addendum:
CLINICAL DATA: Ultrasound-guided core needle biopsy was recommended
of a mass in the [DATE] position of the left breast 4 cm from the
nipple.

EXAM:
ULTRASOUND GUIDED LEFT BREAST CORE NEEDLE BIOPSY

[Series 1: us breast bx w loc dev 1st lesion img bx spec us g · 0.06mm/px · 12 of 14 slices shown]
[im 1/14]
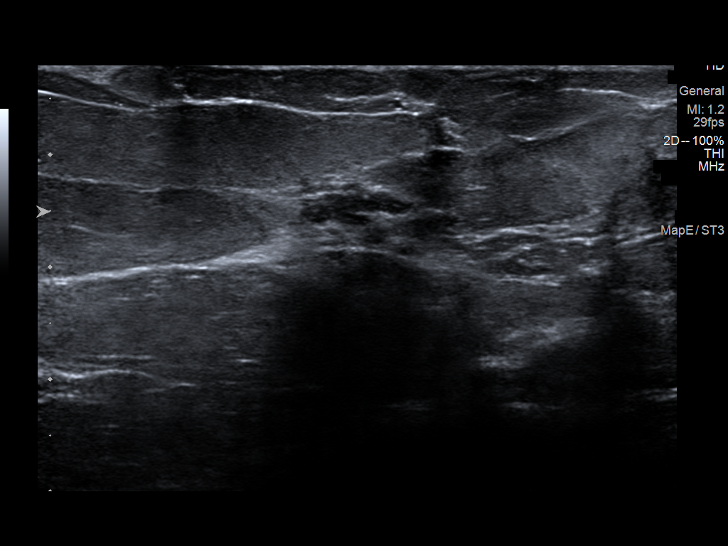
[im 2/14]
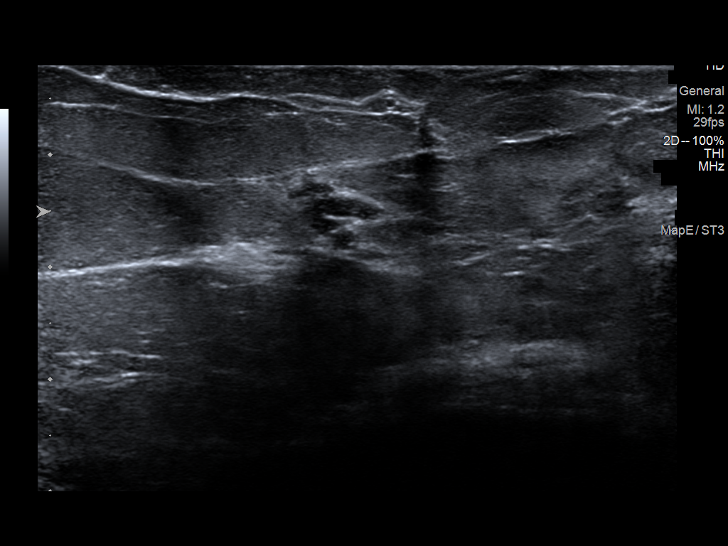
[im 3/14]
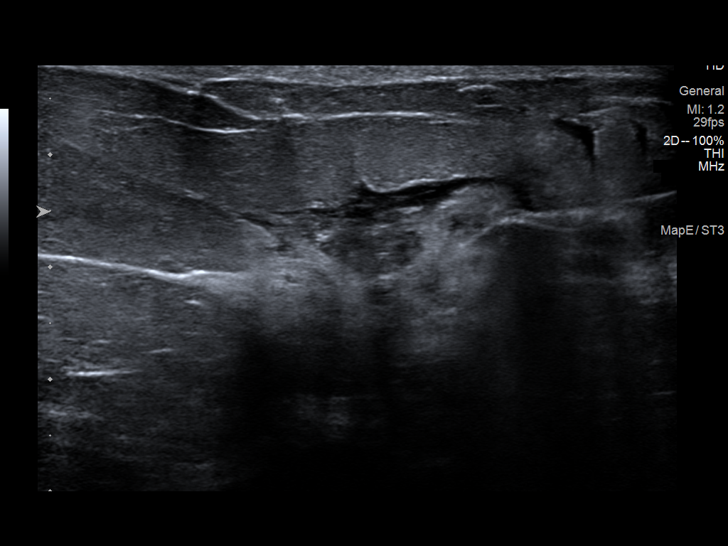
[im 5/14]
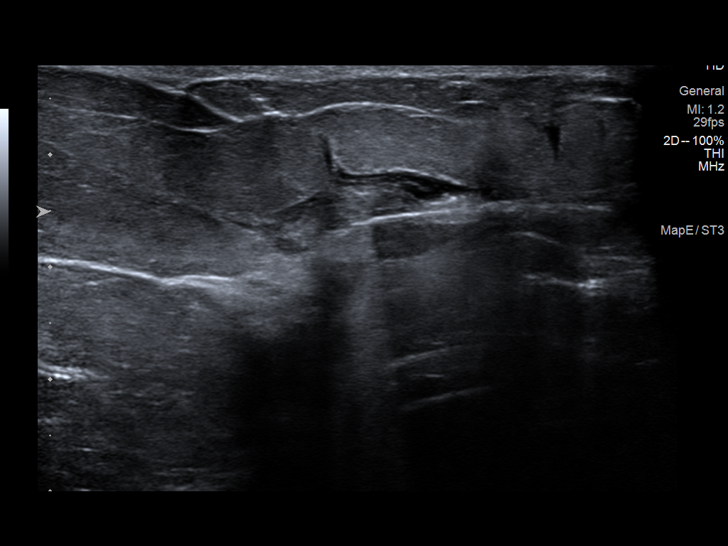
[im 6/14]
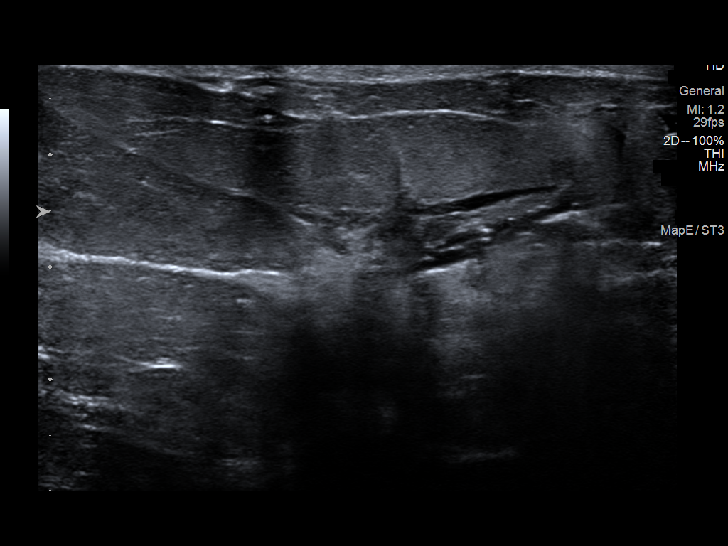
[im 7/14]
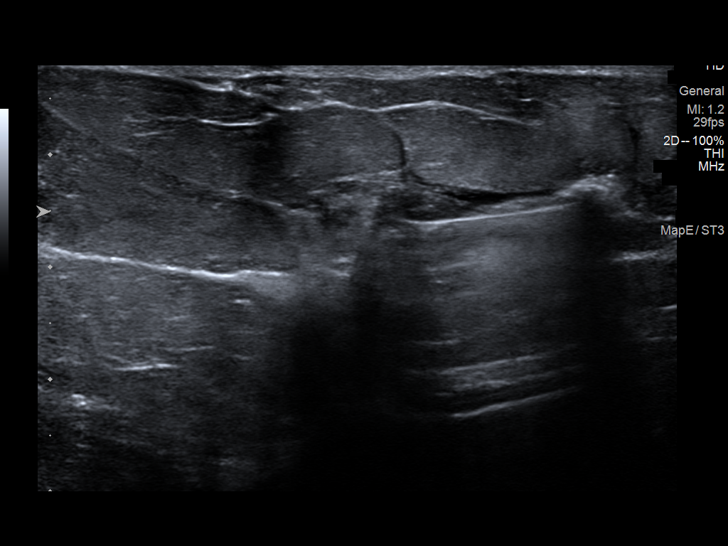
[im 8/14]
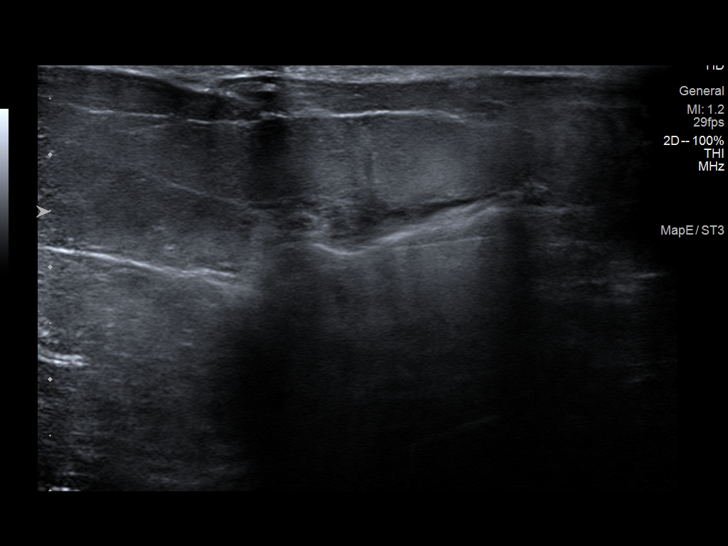
[im 9/14]
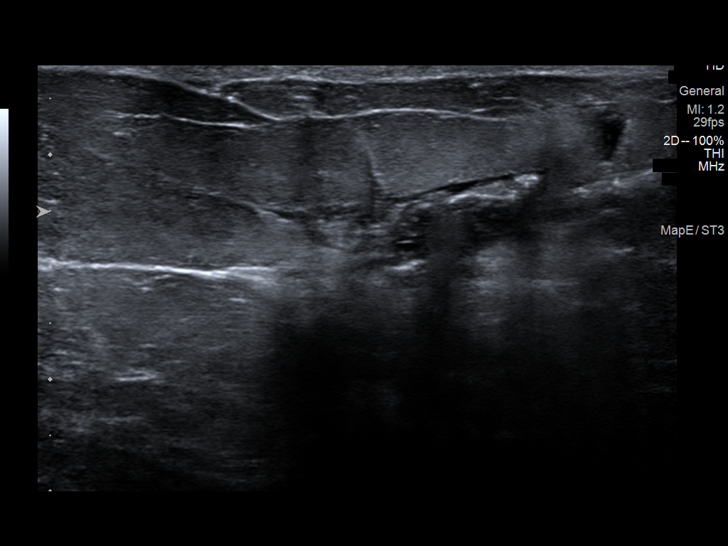
[im 10/14]
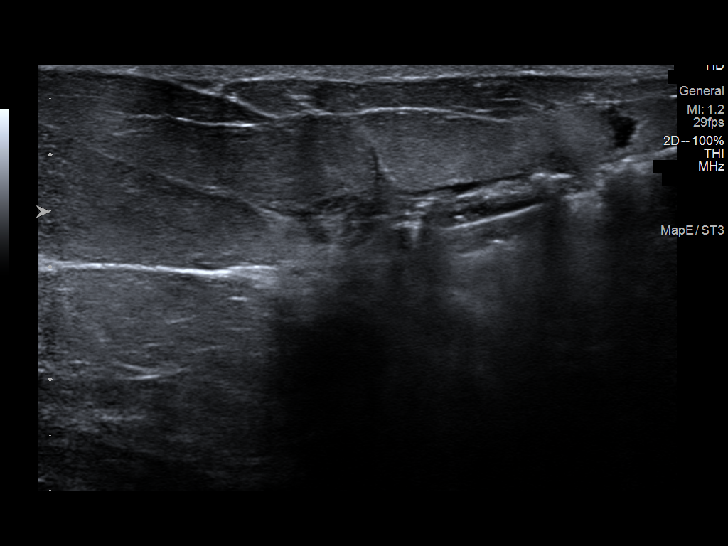
[im 12/14]
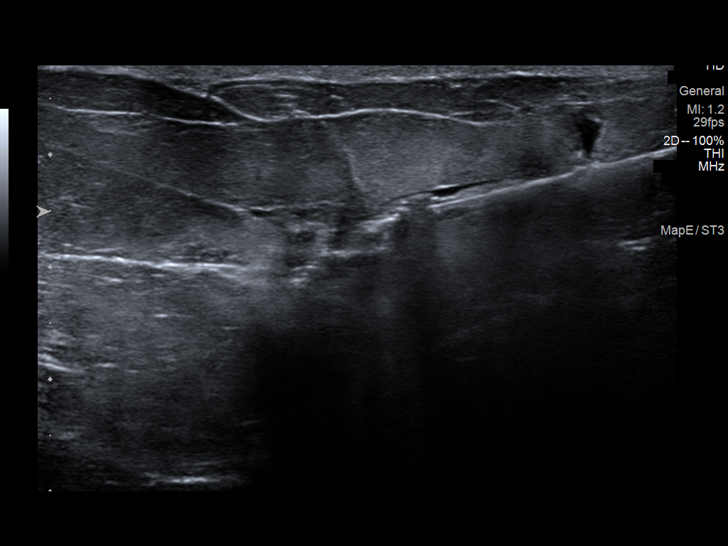
[im 13/14]
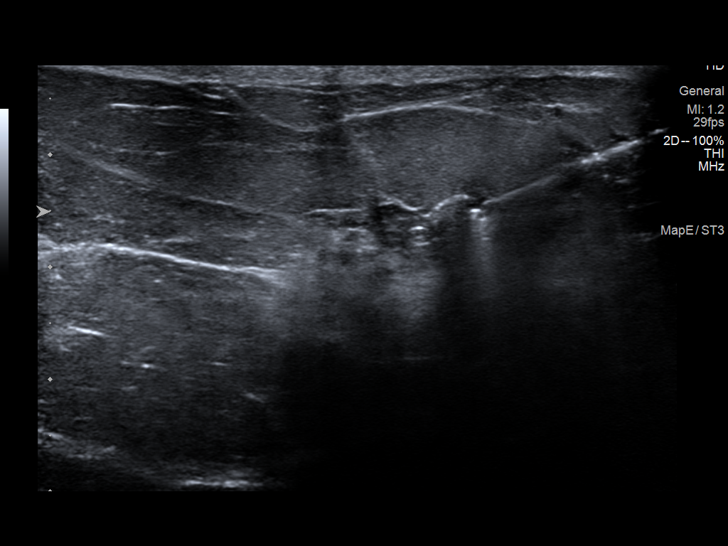
[im 14/14]
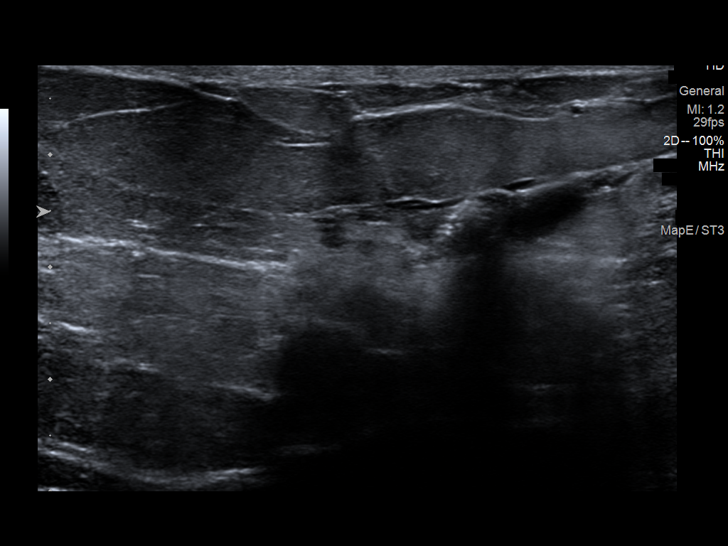

[12 of 14 positions shown; findings below may reference images not displayed]



Lesion quadrant: Lower inner quadrant

Using sterile technique and 1% Lidocaine as local anesthetic, under
direct ultrasound visualization, a 12 gauge Amazigh device was
used to perform biopsy of a 1.3 cm mass [DATE] position left breast 4
cm from nipple using a lateral approach. At the conclusion of the
procedure ribbon tissue marker clip was deployed into the biopsy
cavity. Follow up 2 view mammogram was performed and dictated
separately.
IMPRESSION: Ultrasound guided biopsy of the left breast. No apparent
complications.

ADDENDUM:
Pathology revealed FIBROCYSTIC CHANGE WITH PSEUDOANGIOMATOUS STROMAL
HYPERPLASIA AND USUAL DUCTAL HYPERPLASIA of the LEFT breast, 7:30
o'clock. This was found to be concordant by Dr. Quirijn Amazigh.

Pathology results were discussed with the patient by telephone. The
patient reported doing well after the biopsy with tenderness at the
site. Post biopsy instructions and care were reviewed and questions
were answered. The patient was encouraged to call The [REDACTED]

The patient was instructed to return for RIGHT diagnostic
mammography in 6 months to evaluate probably benign calcifications
and informed a reminder notice would be sent regarding this
appointment.

Pathology results reported by Lys Gretta Josee-Ines, RN on 10/30/2019.



Lesion quadrant: Lower inner quadrant

Using sterile technique and 1% Lidocaine as local anesthetic, under
direct ultrasound visualization, a 12 gauge Amazigh device was
used to perform biopsy of a 1.3 cm mass [DATE] position left breast 4
cm from nipple using a lateral approach. At the conclusion of the
procedure ribbon tissue marker clip was deployed into the biopsy
cavity. Follow up 2 view mammogram was performed and dictated
separately.
IMPRESSION: Ultrasound guided biopsy of the left breast. No apparent
complications.

## 2020-09-17 ENCOUNTER — Other Ambulatory Visit: Payer: Self-pay | Admitting: Family Medicine

## 2020-09-17 DIAGNOSIS — N6489 Other specified disorders of breast: Secondary | ICD-10-CM

## 2020-11-13 DIAGNOSIS — H0102B Squamous blepharitis left eye, upper and lower eyelids: Secondary | ICD-10-CM | POA: Diagnosis not present

## 2020-11-13 DIAGNOSIS — H538 Other visual disturbances: Secondary | ICD-10-CM | POA: Diagnosis not present

## 2020-11-13 DIAGNOSIS — H0102A Squamous blepharitis right eye, upper and lower eyelids: Secondary | ICD-10-CM | POA: Diagnosis not present

## 2020-11-13 DIAGNOSIS — H1045 Other chronic allergic conjunctivitis: Secondary | ICD-10-CM | POA: Diagnosis not present

## 2020-11-14 ENCOUNTER — Other Ambulatory Visit: Payer: Self-pay

## 2020-11-14 ENCOUNTER — Ambulatory Visit
Admission: RE | Admit: 2020-11-14 | Discharge: 2020-11-14 | Disposition: A | Discharge status: Home / Self Care | Payer: BC Managed Care – PPO | Source: Ambulatory Visit | Attending: Family Medicine | Admitting: Family Medicine

## 2020-11-14 DIAGNOSIS — N6489 Other specified disorders of breast: Secondary | ICD-10-CM | POA: Diagnosis not present

## 2020-11-14 DIAGNOSIS — R921 Mammographic calcification found on diagnostic imaging of breast: Secondary | ICD-10-CM | POA: Diagnosis not present

## 2021-02-12 DIAGNOSIS — H0102A Squamous blepharitis right eye, upper and lower eyelids: Secondary | ICD-10-CM | POA: Diagnosis not present

## 2021-02-12 DIAGNOSIS — H1045 Other chronic allergic conjunctivitis: Secondary | ICD-10-CM | POA: Diagnosis not present

## 2021-02-12 DIAGNOSIS — H0102B Squamous blepharitis left eye, upper and lower eyelids: Secondary | ICD-10-CM | POA: Diagnosis not present

## 2021-02-12 DIAGNOSIS — H5213 Myopia, bilateral: Secondary | ICD-10-CM | POA: Diagnosis not present

## 2021-04-23 ENCOUNTER — Ambulatory Visit (INDEPENDENT_AMBULATORY_CARE_PROVIDER_SITE_OTHER): Payer: BC Managed Care – PPO

## 2021-04-23 ENCOUNTER — Encounter: Payer: Self-pay | Admitting: Sports Medicine

## 2021-04-23 ENCOUNTER — Ambulatory Visit (INDEPENDENT_AMBULATORY_CARE_PROVIDER_SITE_OTHER): Payer: BC Managed Care – PPO | Admitting: Sports Medicine

## 2021-04-23 ENCOUNTER — Other Ambulatory Visit: Payer: Self-pay

## 2021-04-23 DIAGNOSIS — M216X2 Other acquired deformities of left foot: Secondary | ICD-10-CM | POA: Diagnosis not present

## 2021-04-23 DIAGNOSIS — L603 Nail dystrophy: Secondary | ICD-10-CM

## 2021-04-23 DIAGNOSIS — M79672 Pain in left foot: Secondary | ICD-10-CM

## 2021-04-23 DIAGNOSIS — M79671 Pain in right foot: Secondary | ICD-10-CM

## 2021-04-23 DIAGNOSIS — M7741 Metatarsalgia, right foot: Secondary | ICD-10-CM

## 2021-04-23 DIAGNOSIS — M779 Enthesopathy, unspecified: Secondary | ICD-10-CM

## 2021-04-23 DIAGNOSIS — M7742 Metatarsalgia, left foot: Secondary | ICD-10-CM

## 2021-04-23 NOTE — Patient Instructions (Signed)
Vinegar soaks 1 cup of white distilled vinegar to 8 cups of warm water.  Soak 20 mins. May repeat soak two times per week.  If there is thickness to nails may file nails after soaks or after bath/shower with nail file and apply tea tree oil. Apply oil daily to nails after filing for the best result.  

## 2021-04-23 NOTE — Progress Notes (Signed)
Subjective: Cassie Flowers is a 50 y.o. female patient who presents to office for evaluation of bilateral foot pain states that right foot it feels similar to how she used to have a stress fracture and on her left foot it feels like there is something stabbing her almost like there is an embedded callus.  Patient denies any significant redness warmth swelling or drainage states that both feet ache and hurt and states that she also has noticed her toenails turning white after she wears nail polish and is wondering if that is fungus.  Patient denies any other pedal complaints at this time.  There are no problems to display for this patient.   Current Outpatient Medications on File Prior to Visit  Medication Sig Dispense Refill   levocetirizine (XYZAL) 5 MG tablet Take 5 mg by mouth daily as needed for allergies.     Montelukast Sodium (SINGULAIR PO) Take by mouth.     PredniSONE 10 MG KIT 12 day taper dose pack. Use as directed 48 each 0   progesterone (PROMETRIUM) 200 MG capsule      thyroid (ARMOUR) 90 MG tablet Take 120 mg by mouth daily. Take 60 mg twice daily.     triamcinolone cream (KENALOG) 0.1 % Apply 1 application topically 2 (two) times daily. 30 g 0   BuPROPion HCl (WELLBUTRIN SR PO) Take by mouth. (Patient not taking: Reported on 04/23/2021)     hydrOXYzine (ATARAX/VISTARIL) 25 MG tablet Take 1 tablet (25 mg total) by mouth every 4 (four) hours as needed for itching. (Patient not taking: No sig reported) 12 tablet 0   No current facility-administered medications on file prior to visit.    No Known Allergies  Objective:  General: Alert and oriented x3 in no acute distress  Dermatology: No open lesions bilateral lower extremities, no webspace macerations, no ecchymosis bilateral, all nails x 10 are well manicured with minimal distal white discoloration to the nails most significantly noted at bilateral hallux no significant subungual debris.  Minimal reactive keratosis plantar fifth  metatarsal head on the left.  Vascular: Dorsalis Pedis and Posterior Tibial pedal pulses palpable, Capillary Fill Time 3 seconds,(+) pedal hair growth bilateral, no edema bilateral lower extremities, Temperature gradient within normal limits.  Neurology: Johney Maine sensation intact via light touch bilateral.  Musculoskeletal: Mild tenderness with fourth and fifth metatarsal heads on the left and diffusely at the proximal metatarsals on the right. Strength within normal limits in all groups bilateral.    Xrays  Left and right foot   Impression: No acute osseous findings.  Soft tissues within normal limits.  Assessment and Plan: Problem List Items Addressed This Visit   None Visit Diagnoses     Capsulitis    -  Primary   Relevant Orders   DG Foot Complete Right (Completed)   Plantar flexed metatarsal bone of left foot       Relevant Orders   DG Foot Complete Left (Completed)   Foot pain, bilateral       Metatarsalgia of both feet       Nail dystrophy            -Complete examination performed -Xrays reviewed -Discussed treatment options for foot pain likely secondary to foot type and nail dystrophy -Recommend at this time for nail dystrophy to try vinegar soaks and applying tea tree oil at this time since the spots goes away when she does not wear nail polish advised patient to also take breaks from nail polish -  Recommend in daily skin emollient plantar foot especially on the left -Dispensed metatarsal pad sleeves and advised patient of this works well may benefit in the long run from custom functional foot orthotics -Patient to return to office in 6 weeks or sooner if condition worsens.  Landis Martins, DPM

## 2021-06-18 ENCOUNTER — Ambulatory Visit: Payer: BC Managed Care – PPO | Admitting: Sports Medicine

## 2021-06-23 DIAGNOSIS — Z131 Encounter for screening for diabetes mellitus: Secondary | ICD-10-CM | POA: Diagnosis not present

## 2021-06-23 DIAGNOSIS — R5381 Other malaise: Secondary | ICD-10-CM | POA: Diagnosis not present

## 2021-06-23 DIAGNOSIS — N951 Menopausal and female climacteric states: Secondary | ICD-10-CM | POA: Diagnosis not present

## 2021-06-23 DIAGNOSIS — E559 Vitamin D deficiency, unspecified: Secondary | ICD-10-CM | POA: Diagnosis not present

## 2021-06-23 DIAGNOSIS — E039 Hypothyroidism, unspecified: Secondary | ICD-10-CM | POA: Diagnosis not present

## 2021-06-23 DIAGNOSIS — R5383 Other fatigue: Secondary | ICD-10-CM | POA: Diagnosis not present

## 2021-06-30 DIAGNOSIS — N951 Menopausal and female climacteric states: Secondary | ICD-10-CM | POA: Diagnosis not present

## 2021-06-30 DIAGNOSIS — E039 Hypothyroidism, unspecified: Secondary | ICD-10-CM | POA: Diagnosis not present

## 2021-06-30 DIAGNOSIS — R5383 Other fatigue: Secondary | ICD-10-CM | POA: Diagnosis not present

## 2021-06-30 DIAGNOSIS — E669 Obesity, unspecified: Secondary | ICD-10-CM | POA: Diagnosis not present

## 2021-06-30 DIAGNOSIS — R5381 Other malaise: Secondary | ICD-10-CM | POA: Diagnosis not present

## 2021-07-23 DIAGNOSIS — L84 Corns and callosities: Secondary | ICD-10-CM | POA: Diagnosis not present

## 2021-07-23 DIAGNOSIS — L920 Granuloma annulare: Secondary | ICD-10-CM | POA: Diagnosis not present

## 2021-10-13 ENCOUNTER — Other Ambulatory Visit: Payer: Self-pay | Admitting: Family Medicine

## 2021-10-13 DIAGNOSIS — R921 Mammographic calcification found on diagnostic imaging of breast: Secondary | ICD-10-CM

## 2021-11-21 ENCOUNTER — Ambulatory Visit
Admission: RE | Admit: 2021-11-21 | Discharge: 2021-11-21 | Disposition: A | Discharge status: Home / Self Care | Payer: BC Managed Care – PPO | Source: Ambulatory Visit | Attending: Family Medicine | Admitting: Family Medicine

## 2021-11-21 DIAGNOSIS — R921 Mammographic calcification found on diagnostic imaging of breast: Secondary | ICD-10-CM

## 2022-01-06 DIAGNOSIS — E559 Vitamin D deficiency, unspecified: Secondary | ICD-10-CM | POA: Diagnosis not present

## 2022-01-06 DIAGNOSIS — E7800 Pure hypercholesterolemia, unspecified: Secondary | ICD-10-CM | POA: Diagnosis not present

## 2022-01-06 DIAGNOSIS — R7309 Other abnormal glucose: Secondary | ICD-10-CM | POA: Diagnosis not present

## 2022-01-06 DIAGNOSIS — E039 Hypothyroidism, unspecified: Secondary | ICD-10-CM | POA: Diagnosis not present

## 2022-01-13 DIAGNOSIS — Z1211 Encounter for screening for malignant neoplasm of colon: Secondary | ICD-10-CM | POA: Diagnosis not present

## 2022-01-13 DIAGNOSIS — Z0000 Encounter for general adult medical examination without abnormal findings: Secondary | ICD-10-CM | POA: Diagnosis not present

## 2022-03-03 ENCOUNTER — Encounter: Payer: Self-pay | Admitting: Internal Medicine

## 2022-03-05 ENCOUNTER — Encounter: Payer: Self-pay | Admitting: Internal Medicine

## 2022-03-29 ENCOUNTER — Ambulatory Visit (AMBULATORY_SURGERY_CENTER): Payer: Self-pay | Admitting: *Deleted

## 2022-03-29 VITALS — Ht 68.0 in | Wt 287.0 lb

## 2022-03-29 DIAGNOSIS — Z1211 Encounter for screening for malignant neoplasm of colon: Secondary | ICD-10-CM

## 2022-03-29 MED ORDER — NA SULFATE-K SULFATE-MG SULF 17.5-3.13-1.6 GM/177ML PO SOLN: 1.0000 | Freq: Once | ORAL | 0 refills | Status: AC

## 2022-03-29 NOTE — Progress Notes (Signed)
No egg or soy allergy known to patient  No issues known to pt with past sedation with any surgeries or procedures Patient denies ever being told they had issues or difficulty with intubation  No FH of Malignant Hyperthermia Pt is not on diet pills Pt is not on  home 02  Pt is not on blood thinners  Pt denies issues with constipation  No A fib or A flutter Have any cardiac testing pending--no Pt instructed to use [email or web address] or GoodRx for a price reduction on prep   

## 2022-04-14 ENCOUNTER — Encounter: Payer: BC Managed Care – PPO | Admitting: Internal Medicine

## 2022-04-15 ENCOUNTER — Encounter: Payer: Self-pay | Admitting: Internal Medicine

## 2022-04-26 ENCOUNTER — Ambulatory Visit (AMBULATORY_SURGERY_CENTER): Payer: BC Managed Care – PPO | Admitting: Internal Medicine

## 2022-04-26 ENCOUNTER — Encounter: Payer: Self-pay | Admitting: Internal Medicine

## 2022-04-26 VITALS — BP 123/83 | HR 81 | Temp 96.2°F | Resp 13 | Ht 68.0 in | Wt 287.0 lb

## 2022-04-26 DIAGNOSIS — D122 Benign neoplasm of ascending colon: Secondary | ICD-10-CM

## 2022-04-26 DIAGNOSIS — Z1211 Encounter for screening for malignant neoplasm of colon: Secondary | ICD-10-CM

## 2022-04-26 DIAGNOSIS — K635 Polyp of colon: Secondary | ICD-10-CM | POA: Diagnosis not present

## 2022-04-26 MED ORDER — SODIUM CHLORIDE 0.9 % IV SOLN: 500.0000 mL | Freq: Once | INTRAVENOUS | Status: DC

## 2022-04-26 NOTE — Patient Instructions (Signed)
  Handouts on polyps & hemorrhoids given to you today  Await pathology results on polyp removed     YOU HAD AN ENDOSCOPIC PROCEDURE TODAY AT [facility]:   Refer to the procedure report that was given to you for any specific questions about what was found during the examination.  If the procedure report does not answer your questions, please call your gastroenterologist to clarify.  If you requested that your care partner not be given the details of your procedure findings, then the procedure report has been included in a sealed envelope for you to review at your convenience later.  YOU SHOULD EXPECT: Some feelings of bloating in the abdomen. Passage of more gas than usual.  Walking can help get rid of the air that was put into your GI tract during the procedure and reduce the bloating. If you had a lower endoscopy (such as a colonoscopy or flexible sigmoidoscopy) you may notice spotting of blood in your stool or on the toilet paper. If you underwent a bowel prep for your procedure, you may not have a normal bowel movement for a few days.  Please Note:  You might notice some irritation and congestion in your nose or some drainage.  This is from the oxygen used during your procedure.  There is no need for concern and it should clear up in a day or so.  SYMPTOMS TO REPORT IMMEDIATELY:  Following lower endoscopy (colonoscopy or flexible sigmoidoscopy):  Excessive amounts of blood in the stool  Significant tenderness or worsening of abdominal pains  Swelling of the abdomen that is new, acute  Fever of 100F or higher   For urgent or emergent issues, a gastroenterologist can be reached at any hour by calling [phone]. Do not use MyChart messaging for urgent concerns.    DIET:  We do recommend a small meal at first, but then you may proceed to your regular diet.  Drink plenty of fluids but you should avoid alcoholic beverages for 24 hours.  ACTIVITY:  You should plan to  take it easy for the rest of today and you should NOT DRIVE or use heavy machinery until tomorrow (because of the sedation medicines used during the test).    FOLLOW UP: Our staff will call the number listed on your records the next business day following your procedure.  We will call around 7:15- 8:00 am to check on you and address any questions or concerns that you may have regarding the information given to you following your procedure. If we do not reach you, we will leave a message.     If any biopsies were taken you will be contacted by phone or by letter within the next 1-3 weeks.  Please call us at [phone] if you have not heard about the biopsies in 3 weeks.    SIGNATURES/CONFIDENTIALITY: You and/or your care partner have signed paperwork which will be entered into your electronic medical record.  These signatures attest to the fact that that the information above on your After Visit Summary has been reviewed and is understood.  Full responsibility of the confidentiality of this discharge information lies with you and/or your care-partner.

## 2022-04-26 NOTE — Progress Notes (Signed)
HISTORY OF PRESENT ILLNESS:  Cassie Flowers is a 51 y.o. female who presents today for routine screening colonoscopy.  No complaints  REVIEW OF SYSTEMS:  All non-GI ROS negative   Past Medical History:  Diagnosis Date   Allergy    seasonal   Anxiety    Depression    Hyperlipidemia    Thyroid disease     Past Surgical History:  Procedure Laterality Date   BREAST BIOPSY Right 05/07/2020   BREAST BIOPSY Left 10/2019   BREAST BIOPSY Left 02/2014   KNEE SURGERY Left    x2    Social History Cassie Flowers  reports that she has never smoked. She has been exposed to tobacco smoke. She has never used smokeless tobacco. She reports current alcohol use. She reports that she does not use drugs.  family history includes Colon cancer in her maternal grandfather; Colon polyps in her father and sister; Rectal cancer in her maternal grandfather.  No Known Allergies     PHYSICAL EXAMINATION: Vital signs: BP 103/77   Pulse 89   Temp (!) 96.2 F (35.7 C) (Temporal)   Ht '5\' 8"'$  (1.727 m)   Wt 287 lb (130.2 kg)   LMP 01/26/2022 (Approximate)   SpO2 97%   BMI 43.64 kg/m  General: Well-developed, well-nourished, no acute distress HEENT: Sclerae are anicteric, conjunctiva pink. Oral mucosa intact Lungs: Clear Heart: Regular Abdomen: soft, nontender, nondistended, no obvious ascites, no peritoneal signs, normal bowel sounds. No organomegaly. Extremities: No edema Psychiatric: alert and oriented x3. Cooperative     ASSESSMENT:  Colon cancer screening   PLAN:   Screening colonoscopy

## 2022-04-26 NOTE — Op Note (Signed)
[name] Procedure Date: 04/26/2022 3:04 PM MRN: [id number] Endoscopist: Docia Chuck. Cassie Flowers , MD, [id number] Age: 51 Referring MD:  Date of Birth: January 01, 1971 Gender: Female Account #: [id number] Procedure:                Colonoscopy with cold snare polypectomy x 1 Indications:              Screening for colorectal malignant neoplasm Medicines:                Monitored Anesthesia Care Procedure:                Pre-Anesthesia Assessment:                           - Prior to the procedure, a History and Physical                            was performed, and patient medications and                            allergies were reviewed. The patient's tolerance of                            previous anesthesia was also reviewed. The risks                            and benefits of the procedure and the sedation                            options and risks were discussed with the patient.                            All questions were answered, and informed consent                            was obtained. Prior Anticoagulants: The patient has                            taken no anticoagulant or antiplatelet agents. ASA                            Grade Assessment: II - A patient with mild systemic                            disease. After reviewing the risks and benefits,                            the patient was deemed in satisfactory condition to                            undergo the procedure.                           After obtaining informed consent, the colonoscope  was passed under direct vision. Throughout the                            procedure, the patient's blood pressure, pulse, and                            oxygen saturations were monitored continuously. The                            Olympus CF-HQ190L [id number]) Colonoscope was                            introduced through the anus and advanced to the the                             cecum, identified by appendiceal orifice and                            ileocecal valve. The ileocecal valve, appendiceal                            orifice, and rectum were photographed. The quality                            of the bowel preparation was excellent. The                            colonoscopy was performed without difficulty. The                            patient tolerated the procedure well. The bowel                            preparation used was SUPREP via split dose                            instruction. Scope In: 3:10:08 PM Scope Out: 3:23:56 PM Scope Withdrawal Time: 0 hours 8 minutes 26 seconds  Total Procedure Duration: 0 hours 13 minutes 48 seconds  Findings:                 A 2 mm polyp was found in the ascending colon. The                            polyp was sessile. The polyp was removed with a                            cold snare. Resection and retrieval were complete.                           Internal hemorrhoids were found during                            retroflexion. The hemorrhoids were small.  The exam was otherwise without abnormality on                            direct and retroflexion views. Complications:            No immediate complications. Estimated blood loss:                            None. Estimated Blood Loss:     Estimated blood loss: none. Impression:               - One 2 mm polyp in the ascending colon, removed                            with a cold snare. Resected and retrieved.                           - Internal hemorrhoids.                           - The examination was otherwise normal on direct                            and retroflexion views. Recommendation:           - Repeat colonoscopy in 10 years for surveillance.                           - Patient has a contact number available for                            emergencies. The signs and symptoms of potential                             delayed complications were discussed with the                            patient. Return to normal activities tomorrow.                            Written discharge instructions were provided to the                            patient.                           - Resume previous diet.                           - Continue present medications.                           - Await pathology results. Docia Chuck. Cassie Pastor, MD 04/26/2022 3:28:25 PM This report has been signed electronically.

## 2022-04-26 NOTE — Progress Notes (Signed)
Sedate, gd SR, tolerated procedure well, VSS, report to RN 

## 2022-04-26 NOTE — Progress Notes (Signed)
Called to room to assist during endoscopic procedure.  Patient ID and intended procedure confirmed with present staff. Received instructions for my participation in the procedure from the performing physician.  

## 2022-04-26 NOTE — Progress Notes (Signed)
VS completed by DT.  Pt's states no medical or surgical changes since previsit or office visit.  

## 2022-04-27 ENCOUNTER — Telehealth: Payer: Self-pay

## 2022-04-27 NOTE — Telephone Encounter (Signed)
  Follow up Call-     04/26/2022    1:36 PM  Call back number  Post procedure Call Back phone  # [phone]  Permission to leave phone message Yes     Patient questions:  Do you have a fever, pain , or abdominal swelling? No. Pain Score  0 *  Have you tolerated food without any problems? Yes.    Have you been able to return to your normal activities? Yes.    Do you have any questions about your discharge instructions: Diet   No. Medications  No. Follow up visit  No.  Do you have questions or concerns about your Care? No.  Actions: * If pain score is 4 or above: No action needed, pain <4.

## 2022-04-29 ENCOUNTER — Encounter: Payer: Self-pay | Admitting: Internal Medicine

## 2022-05-05 DIAGNOSIS — E610 Copper deficiency: Secondary | ICD-10-CM | POA: Diagnosis not present

## 2022-05-05 DIAGNOSIS — N926 Irregular menstruation, unspecified: Secondary | ICD-10-CM | POA: Diagnosis not present

## 2022-05-05 DIAGNOSIS — R5381 Other malaise: Secondary | ICD-10-CM | POA: Diagnosis not present

## 2022-05-05 DIAGNOSIS — E559 Vitamin D deficiency, unspecified: Secondary | ICD-10-CM | POA: Diagnosis not present

## 2022-05-05 DIAGNOSIS — F3289 Other specified depressive episodes: Secondary | ICD-10-CM | POA: Diagnosis not present

## 2022-05-05 DIAGNOSIS — E60 Dietary zinc deficiency: Secondary | ICD-10-CM | POA: Diagnosis not present

## 2022-05-05 DIAGNOSIS — E039 Hypothyroidism, unspecified: Secondary | ICD-10-CM | POA: Diagnosis not present

## 2022-05-20 DIAGNOSIS — M170 Bilateral primary osteoarthritis of knee: Secondary | ICD-10-CM | POA: Diagnosis not present

## 2022-05-28 DIAGNOSIS — N951 Menopausal and female climacteric states: Secondary | ICD-10-CM | POA: Diagnosis not present

## 2022-05-28 DIAGNOSIS — R5381 Other malaise: Secondary | ICD-10-CM | POA: Diagnosis not present

## 2022-05-28 DIAGNOSIS — E039 Hypothyroidism, unspecified: Secondary | ICD-10-CM | POA: Diagnosis not present

## 2022-05-28 DIAGNOSIS — E669 Obesity, unspecified: Secondary | ICD-10-CM | POA: Diagnosis not present

## 2022-07-29 DIAGNOSIS — E785 Hyperlipidemia, unspecified: Secondary | ICD-10-CM | POA: Diagnosis not present

## 2022-08-05 ENCOUNTER — Other Ambulatory Visit (HOSPITAL_COMMUNITY): Payer: Self-pay | Admitting: Family Medicine

## 2022-08-05 DIAGNOSIS — E7800 Pure hypercholesterolemia, unspecified: Secondary | ICD-10-CM | POA: Diagnosis not present

## 2022-08-05 DIAGNOSIS — Z8249 Family history of ischemic heart disease and other diseases of the circulatory system: Secondary | ICD-10-CM | POA: Diagnosis not present

## 2022-08-05 DIAGNOSIS — R32 Unspecified urinary incontinence: Secondary | ICD-10-CM | POA: Diagnosis not present

## 2022-08-05 DIAGNOSIS — E039 Hypothyroidism, unspecified: Secondary | ICD-10-CM | POA: Diagnosis not present

## 2022-08-25 ENCOUNTER — Ambulatory Visit (HOSPITAL_COMMUNITY)
Admission: RE | Admit: 2022-08-25 | Discharge: 2022-08-25 | Disposition: A | Discharge status: Home / Self Care | Payer: BC Managed Care – PPO | Source: Ambulatory Visit | Attending: Family Medicine | Admitting: Family Medicine

## 2022-08-25 DIAGNOSIS — E7800 Pure hypercholesterolemia, unspecified: Secondary | ICD-10-CM | POA: Insufficient documentation

## 2022-08-25 DIAGNOSIS — Z8249 Family history of ischemic heart disease and other diseases of the circulatory system: Secondary | ICD-10-CM

## 2022-09-20 IMAGING — MG DIGITAL DIAGNOSTIC BILAT W/ TOMO W/ CAD
8 of 11 series · 8 of 27 positions shown · non-contrast
Comparison: Previous exam(s).

CLINICAL DATA: 51-year-old female presenting for final follow-up of
likely benign right breast calcifications.

EXAM:
DIGITAL DIAGNOSTIC BILATERAL MAMMOGRAM WITH TOMOSYNTHESIS AND CAD
TECHNIQUE: Bilateral digital diagnostic mammography and breast tomosynthesis
was performed. The images were evaluated with computer-aided
detection.

[R CC (1 of 2)]
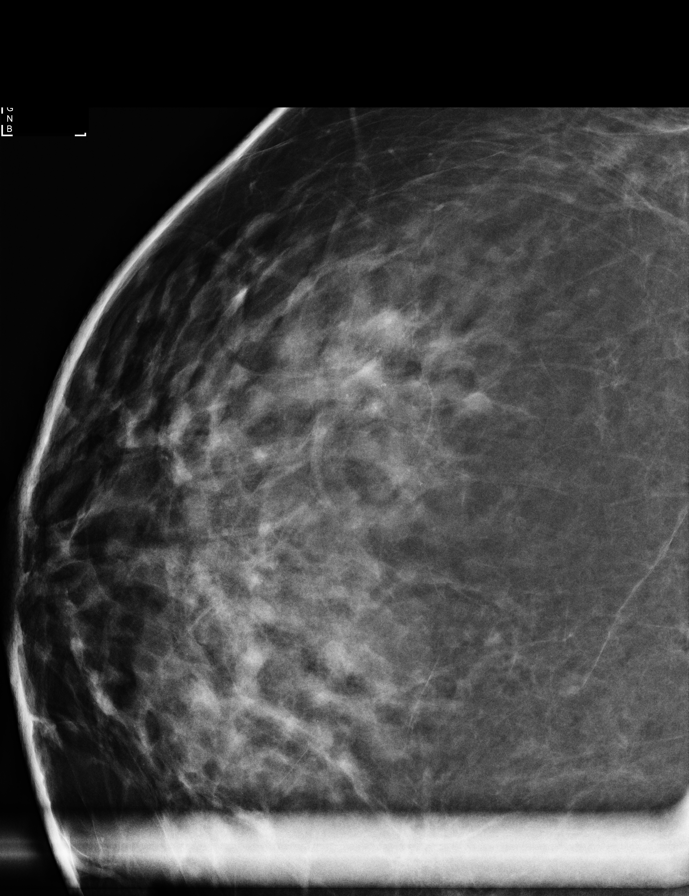

[R ML]
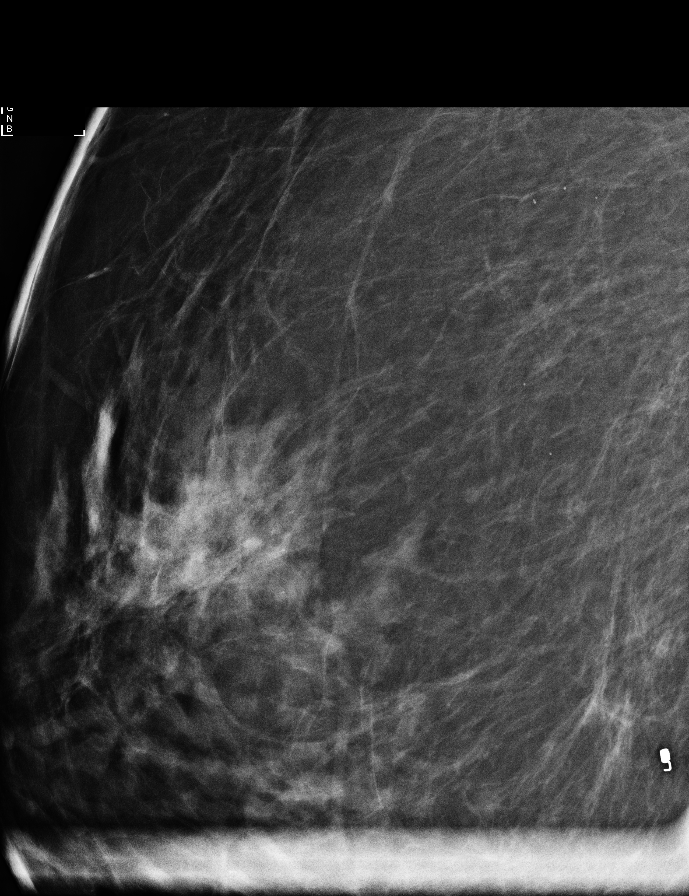

[R CC (2 of 2)]
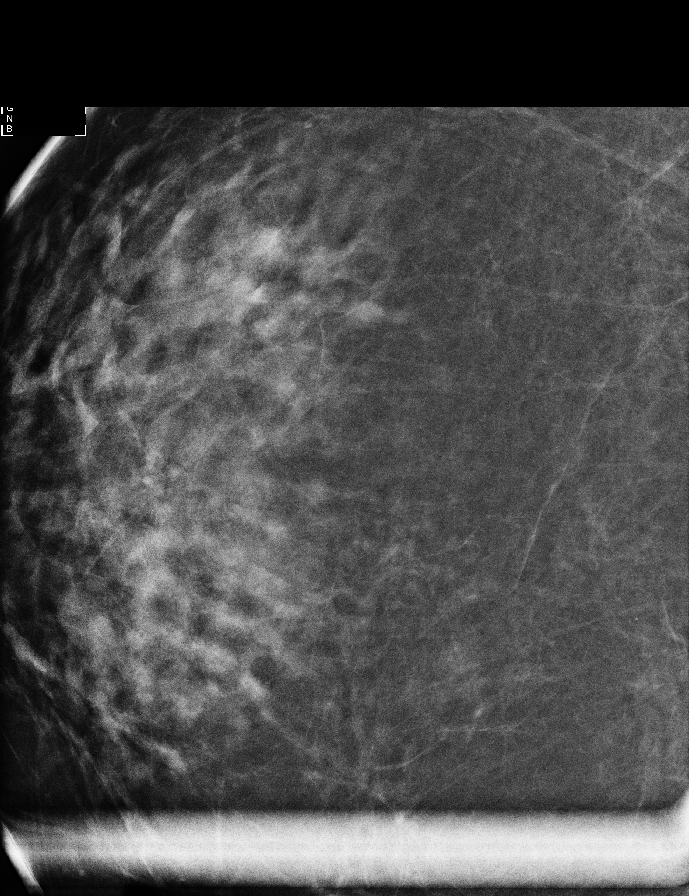

[L MLO synth-2D]
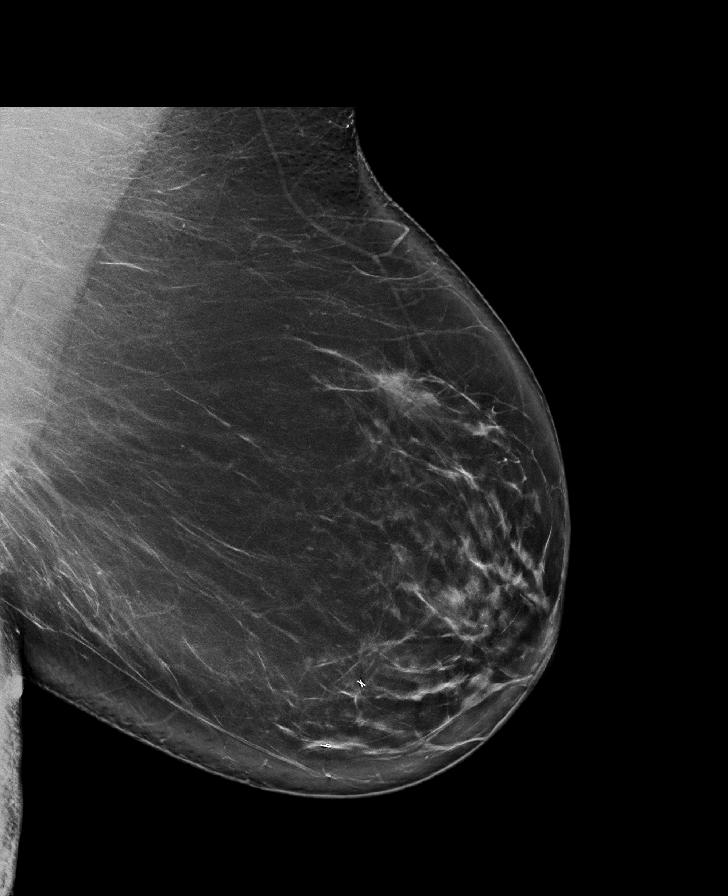

[L CC synth-2D]
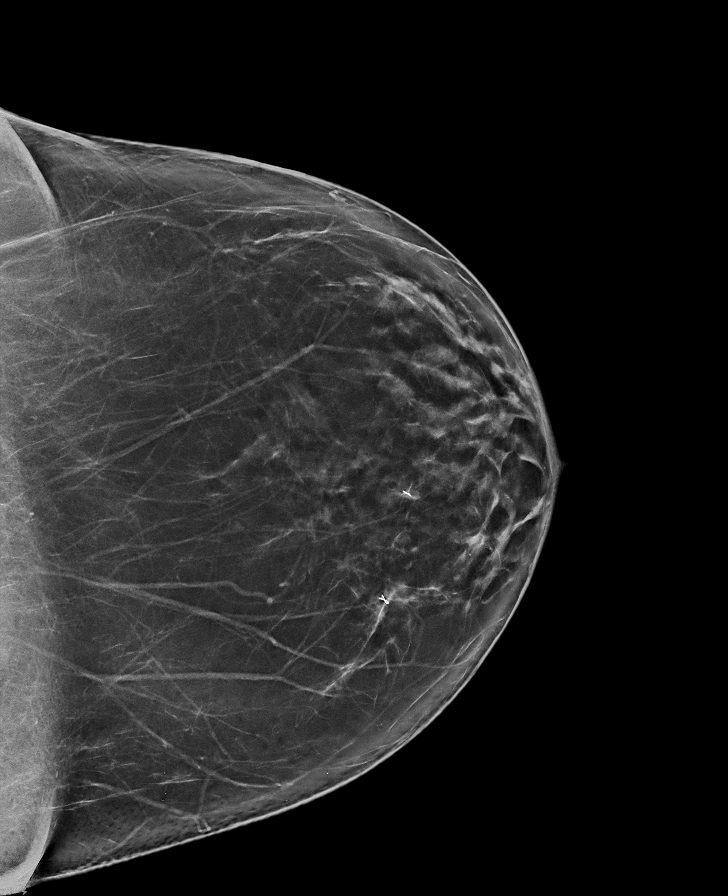

[R MLO synth-2D]
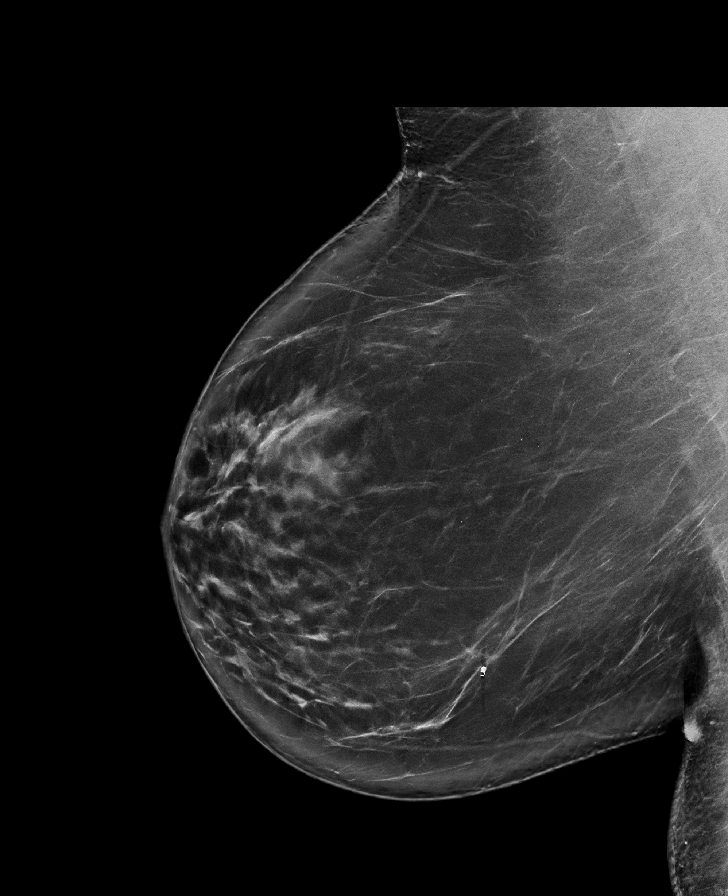

[R CC synth-2D]
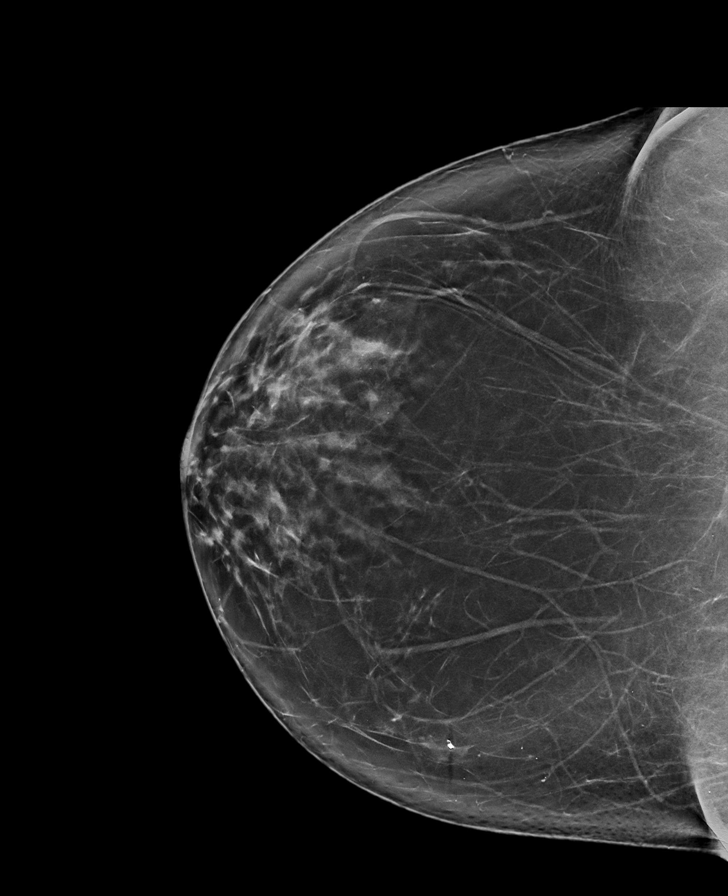

[R MLO tomo · tomo slice 51/101.0]
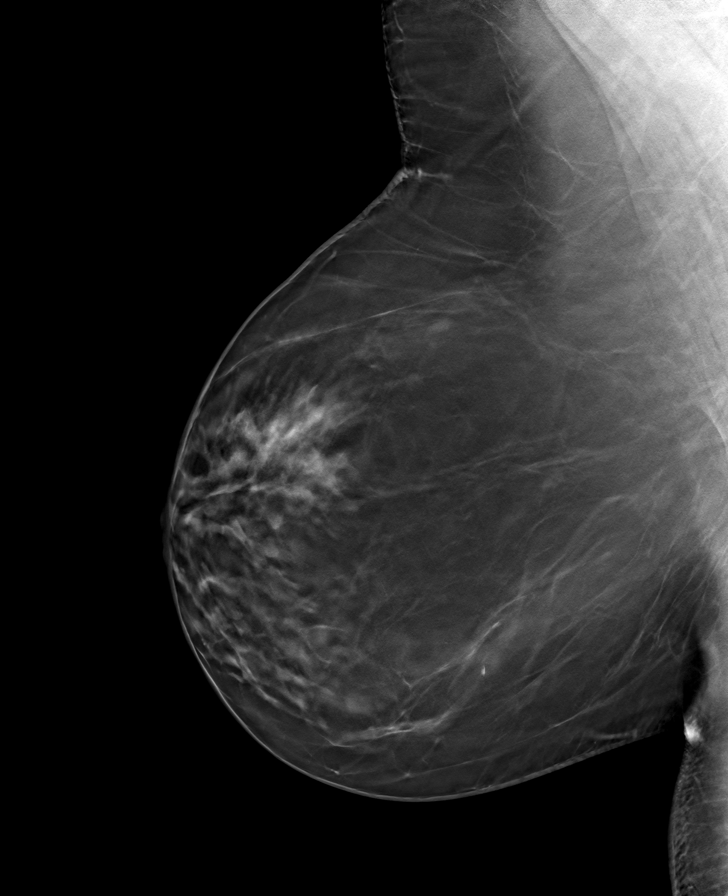

[8 of 27 positions shown; findings below may reference images not displayed]

ACR Breast Density Category c: The breast tissue is heterogeneously
dense, which may obscure small masses.
FINDINGS: The loosely grouped amorphous calcifications in the upper-outer
right breast are mammographically stable. No suspicious
calcifications, masses or areas of distortion are seen in the
bilateral breasts.
IMPRESSION: 1. The calcifications in the right breast have demonstrated 2 years
of stability, and are therefore considered benign.

2.  No mammographic evidence of malignancy in the bilateral breasts.

RECOMMENDATION:
Screening mammogram in one year.(Code:N4-H-1XQ)

I have discussed the findings and recommendations with the patient.
If applicable, a reminder letter will be sent to the patient
regarding the next appointment.

BI-RADS CATEGORY  2: Benign.

## 2022-11-02 DIAGNOSIS — R5381 Other malaise: Secondary | ICD-10-CM | POA: Diagnosis not present

## 2022-11-02 DIAGNOSIS — N951 Menopausal and female climacteric states: Secondary | ICD-10-CM | POA: Diagnosis not present

## 2022-11-02 DIAGNOSIS — E538 Deficiency of other specified B group vitamins: Secondary | ICD-10-CM | POA: Diagnosis not present

## 2022-11-02 DIAGNOSIS — E039 Hypothyroidism, unspecified: Secondary | ICD-10-CM | POA: Diagnosis not present

## 2022-11-02 DIAGNOSIS — E669 Obesity, unspecified: Secondary | ICD-10-CM | POA: Diagnosis not present

## 2022-11-02 DIAGNOSIS — E559 Vitamin D deficiency, unspecified: Secondary | ICD-10-CM | POA: Diagnosis not present

## 2022-11-09 DIAGNOSIS — R5381 Other malaise: Secondary | ICD-10-CM | POA: Diagnosis not present

## 2022-11-09 DIAGNOSIS — E538 Deficiency of other specified B group vitamins: Secondary | ICD-10-CM | POA: Diagnosis not present

## 2022-11-09 DIAGNOSIS — N951 Menopausal and female climacteric states: Secondary | ICD-10-CM | POA: Diagnosis not present

## 2022-11-09 DIAGNOSIS — E039 Hypothyroidism, unspecified: Secondary | ICD-10-CM | POA: Diagnosis not present

## 2022-11-09 DIAGNOSIS — E669 Obesity, unspecified: Secondary | ICD-10-CM | POA: Diagnosis not present

## 2022-11-10 DIAGNOSIS — R32 Unspecified urinary incontinence: Secondary | ICD-10-CM | POA: Diagnosis not present

## 2022-11-11 DIAGNOSIS — H10403 Unspecified chronic conjunctivitis, bilateral: Secondary | ICD-10-CM | POA: Diagnosis not present

## 2022-11-11 DIAGNOSIS — H0102B Squamous blepharitis left eye, upper and lower eyelids: Secondary | ICD-10-CM | POA: Diagnosis not present

## 2022-11-11 DIAGNOSIS — H1045 Other chronic allergic conjunctivitis: Secondary | ICD-10-CM | POA: Diagnosis not present

## 2022-11-11 DIAGNOSIS — H0102A Squamous blepharitis right eye, upper and lower eyelids: Secondary | ICD-10-CM | POA: Diagnosis not present

## 2022-11-22 DIAGNOSIS — L2089 Other atopic dermatitis: Secondary | ICD-10-CM | POA: Diagnosis not present

## 2022-11-22 DIAGNOSIS — L218 Other seborrheic dermatitis: Secondary | ICD-10-CM | POA: Diagnosis not present

## 2022-11-30 DIAGNOSIS — L84 Corns and callosities: Secondary | ICD-10-CM | POA: Diagnosis not present

## 2022-11-30 DIAGNOSIS — L218 Other seborrheic dermatitis: Secondary | ICD-10-CM | POA: Diagnosis not present

## 2022-12-22 ENCOUNTER — Other Ambulatory Visit: Payer: Self-pay | Admitting: Family Medicine

## 2022-12-22 DIAGNOSIS — Z1231 Encounter for screening mammogram for malignant neoplasm of breast: Secondary | ICD-10-CM

## 2022-12-27 ENCOUNTER — Ambulatory Visit: Admission: RE | Admit: 2022-12-27 | Payer: BC Managed Care – PPO | Source: Ambulatory Visit

## 2022-12-27 DIAGNOSIS — Z1231 Encounter for screening mammogram for malignant neoplasm of breast: Secondary | ICD-10-CM

## 2022-12-29 ENCOUNTER — Other Ambulatory Visit: Payer: Self-pay | Admitting: Family Medicine

## 2022-12-29 DIAGNOSIS — R928 Other abnormal and inconclusive findings on diagnostic imaging of breast: Secondary | ICD-10-CM

## 2023-01-07 ENCOUNTER — Ambulatory Visit
Admission: RE | Admit: 2023-01-07 | Discharge: 2023-01-07 | Disposition: A | Discharge status: Home / Self Care | Payer: BC Managed Care – PPO | Source: Ambulatory Visit | Attending: Family Medicine | Admitting: Family Medicine

## 2023-01-07 ENCOUNTER — Ambulatory Visit: Payer: BC Managed Care – PPO

## 2023-01-07 DIAGNOSIS — R928 Other abnormal and inconclusive findings on diagnostic imaging of breast: Secondary | ICD-10-CM | POA: Diagnosis not present

## 2023-01-25 DIAGNOSIS — M7662 Achilles tendinitis, left leg: Secondary | ICD-10-CM | POA: Diagnosis not present

## 2023-01-25 DIAGNOSIS — M79672 Pain in left foot: Secondary | ICD-10-CM | POA: Diagnosis not present

## 2023-05-19 DIAGNOSIS — N926 Irregular menstruation, unspecified: Secondary | ICD-10-CM | POA: Diagnosis not present

## 2023-05-19 DIAGNOSIS — R5381 Other malaise: Secondary | ICD-10-CM | POA: Diagnosis not present

## 2023-05-19 DIAGNOSIS — E669 Obesity, unspecified: Secondary | ICD-10-CM | POA: Diagnosis not present

## 2023-05-19 DIAGNOSIS — E039 Hypothyroidism, unspecified: Secondary | ICD-10-CM | POA: Diagnosis not present

## 2023-05-19 DIAGNOSIS — E538 Deficiency of other specified B group vitamins: Secondary | ICD-10-CM | POA: Diagnosis not present

## 2023-05-19 DIAGNOSIS — L659 Nonscarring hair loss, unspecified: Secondary | ICD-10-CM | POA: Diagnosis not present

## 2023-05-28 DIAGNOSIS — M542 Cervicalgia: Secondary | ICD-10-CM | POA: Diagnosis not present

## 2023-06-02 DIAGNOSIS — M542 Cervicalgia: Secondary | ICD-10-CM | POA: Diagnosis not present

## 2023-06-02 DIAGNOSIS — M62830 Muscle spasm of back: Secondary | ICD-10-CM | POA: Diagnosis not present

## 2023-06-02 DIAGNOSIS — M6281 Muscle weakness (generalized): Secondary | ICD-10-CM | POA: Diagnosis not present

## 2023-06-07 DIAGNOSIS — M6281 Muscle weakness (generalized): Secondary | ICD-10-CM | POA: Diagnosis not present

## 2023-06-07 DIAGNOSIS — M62830 Muscle spasm of back: Secondary | ICD-10-CM | POA: Diagnosis not present

## 2023-06-07 DIAGNOSIS — M542 Cervicalgia: Secondary | ICD-10-CM | POA: Diagnosis not present

## 2023-06-09 DIAGNOSIS — M542 Cervicalgia: Secondary | ICD-10-CM | POA: Diagnosis not present

## 2023-06-09 DIAGNOSIS — M6281 Muscle weakness (generalized): Secondary | ICD-10-CM | POA: Diagnosis not present

## 2023-06-09 DIAGNOSIS — M62830 Muscle spasm of back: Secondary | ICD-10-CM | POA: Diagnosis not present

## 2023-06-14 DIAGNOSIS — M62830 Muscle spasm of back: Secondary | ICD-10-CM | POA: Diagnosis not present

## 2023-06-14 DIAGNOSIS — M6281 Muscle weakness (generalized): Secondary | ICD-10-CM | POA: Diagnosis not present

## 2023-06-14 DIAGNOSIS — M542 Cervicalgia: Secondary | ICD-10-CM | POA: Diagnosis not present

## 2023-12-13 ENCOUNTER — Other Ambulatory Visit: Payer: Self-pay | Admitting: Family Medicine

## 2023-12-13 DIAGNOSIS — Z1231 Encounter for screening mammogram for malignant neoplasm of breast: Secondary | ICD-10-CM

## 2024-01-09 ENCOUNTER — Ambulatory Visit
Admission: RE | Admit: 2024-01-09 | Discharge: 2024-01-09 | Disposition: A | Discharge status: Home / Self Care | Source: Ambulatory Visit | Attending: Family Medicine | Admitting: Family Medicine

## 2024-01-09 DIAGNOSIS — Z1231 Encounter for screening mammogram for malignant neoplasm of breast: Secondary | ICD-10-CM
